# Patient Record
Sex: Female | Born: 1935 | Race: White | Hispanic: No | Smoking: Never smoker
Health system: Southern US, Community
[De-identification: ages and names within clinical notes are randomized; demographics above are authoritative.]

## PROBLEM LIST (undated history)

## (undated) DIAGNOSIS — I495 Sick sinus syndrome: Secondary | ICD-10-CM

## (undated) DIAGNOSIS — I2699 Other pulmonary embolism without acute cor pulmonale: Secondary | ICD-10-CM

## (undated) DIAGNOSIS — I10 Essential (primary) hypertension: Secondary | ICD-10-CM

## (undated) DIAGNOSIS — M069 Rheumatoid arthritis, unspecified: Secondary | ICD-10-CM

## (undated) DIAGNOSIS — J4530 Mild persistent asthma, uncomplicated: Secondary | ICD-10-CM

## (undated) DIAGNOSIS — I2510 Atherosclerotic heart disease of native coronary artery without angina pectoris: Secondary | ICD-10-CM

## (undated) DIAGNOSIS — E039 Hypothyroidism, unspecified: Secondary | ICD-10-CM

## (undated) DIAGNOSIS — E785 Hyperlipidemia, unspecified: Secondary | ICD-10-CM

## (undated) DIAGNOSIS — J8410 Pulmonary fibrosis, unspecified: Secondary | ICD-10-CM

## (undated) DIAGNOSIS — F0390 Unspecified dementia without behavioral disturbance: Secondary | ICD-10-CM

## (undated) DIAGNOSIS — H3530 Unspecified macular degeneration: Secondary | ICD-10-CM

## (undated) DIAGNOSIS — D649 Anemia, unspecified: Secondary | ICD-10-CM

## (undated) HISTORY — PX: CHOLECYSTECTOMY: SHX55

## (undated) HISTORY — DX: Mild persistent asthma, uncomplicated: J45.30

## (undated) HISTORY — PX: CARDIAC CATHETERIZATION: SHX172

## (undated) HISTORY — PX: ABDOMINAL HYSTERECTOMY: SUR658

## (undated) HISTORY — PX: TONSILLECTOMY AND ADENOIDECTOMY: SUR1326

---
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
## 2014-03-12 DIAGNOSIS — R079 Chest pain, unspecified: Secondary | ICD-10-CM | POA: Insufficient documentation

## 2014-03-12 DIAGNOSIS — R059 Cough, unspecified: Secondary | ICD-10-CM | POA: Insufficient documentation

## 2014-03-12 HISTORY — DX: Chest pain, unspecified: R07.9

## 2014-03-12 HISTORY — DX: Cough, unspecified: R05.9

## 2014-03-14 DIAGNOSIS — I442 Atrioventricular block, complete: Secondary | ICD-10-CM | POA: Insufficient documentation

## 2014-03-14 HISTORY — PX: PACEMAKER PLACEMENT: SHX43

## 2014-03-14 HISTORY — DX: Atrioventricular block, complete: I44.2

## 2014-06-18 DIAGNOSIS — Z950 Presence of cardiac pacemaker: Secondary | ICD-10-CM

## 2014-06-18 DIAGNOSIS — I495 Sick sinus syndrome: Secondary | ICD-10-CM | POA: Insufficient documentation

## 2014-06-18 HISTORY — DX: Presence of cardiac pacemaker: Z95.0

## 2014-11-23 DIAGNOSIS — R55 Syncope and collapse: Secondary | ICD-10-CM | POA: Insufficient documentation

## 2014-11-23 HISTORY — DX: Syncope and collapse: R55

## 2016-03-07 DIAGNOSIS — I10 Essential (primary) hypertension: Secondary | ICD-10-CM | POA: Diagnosis not present

## 2016-03-07 DIAGNOSIS — K219 Gastro-esophageal reflux disease without esophagitis: Secondary | ICD-10-CM

## 2016-03-07 DIAGNOSIS — E039 Hypothyroidism, unspecified: Secondary | ICD-10-CM

## 2016-03-07 DIAGNOSIS — I2699 Other pulmonary embolism without acute cor pulmonale: Secondary | ICD-10-CM

## 2016-03-07 DIAGNOSIS — E875 Hyperkalemia: Secondary | ICD-10-CM | POA: Diagnosis not present

## 2016-03-07 DIAGNOSIS — I2510 Atherosclerotic heart disease of native coronary artery without angina pectoris: Secondary | ICD-10-CM | POA: Diagnosis not present

## 2016-03-07 DIAGNOSIS — F0390 Unspecified dementia without behavioral disturbance: Secondary | ICD-10-CM

## 2016-03-07 DIAGNOSIS — M1990 Unspecified osteoarthritis, unspecified site: Secondary | ICD-10-CM

## 2016-03-07 DIAGNOSIS — M069 Rheumatoid arthritis, unspecified: Secondary | ICD-10-CM

## 2016-03-08 DIAGNOSIS — I2510 Atherosclerotic heart disease of native coronary artery without angina pectoris: Secondary | ICD-10-CM | POA: Diagnosis not present

## 2016-03-08 DIAGNOSIS — F0390 Unspecified dementia without behavioral disturbance: Secondary | ICD-10-CM | POA: Diagnosis not present

## 2016-03-08 DIAGNOSIS — E875 Hyperkalemia: Secondary | ICD-10-CM | POA: Diagnosis not present

## 2016-03-08 DIAGNOSIS — I10 Essential (primary) hypertension: Secondary | ICD-10-CM | POA: Diagnosis not present

## 2016-03-09 DIAGNOSIS — I2510 Atherosclerotic heart disease of native coronary artery without angina pectoris: Secondary | ICD-10-CM | POA: Diagnosis not present

## 2016-03-09 DIAGNOSIS — F0390 Unspecified dementia without behavioral disturbance: Secondary | ICD-10-CM | POA: Diagnosis not present

## 2016-03-09 DIAGNOSIS — I10 Essential (primary) hypertension: Secondary | ICD-10-CM | POA: Diagnosis not present

## 2016-03-09 DIAGNOSIS — E875 Hyperkalemia: Secondary | ICD-10-CM | POA: Diagnosis not present

## 2016-03-10 DIAGNOSIS — F0390 Unspecified dementia without behavioral disturbance: Secondary | ICD-10-CM | POA: Diagnosis not present

## 2016-03-10 DIAGNOSIS — E875 Hyperkalemia: Secondary | ICD-10-CM | POA: Diagnosis not present

## 2016-03-10 DIAGNOSIS — I2510 Atherosclerotic heart disease of native coronary artery without angina pectoris: Secondary | ICD-10-CM | POA: Diagnosis not present

## 2016-03-10 DIAGNOSIS — I10 Essential (primary) hypertension: Secondary | ICD-10-CM | POA: Diagnosis not present

## 2016-03-11 DIAGNOSIS — I10 Essential (primary) hypertension: Secondary | ICD-10-CM | POA: Diagnosis not present

## 2016-03-11 DIAGNOSIS — E875 Hyperkalemia: Secondary | ICD-10-CM | POA: Diagnosis not present

## 2016-03-11 DIAGNOSIS — F0390 Unspecified dementia without behavioral disturbance: Secondary | ICD-10-CM | POA: Diagnosis not present

## 2016-03-11 DIAGNOSIS — I2510 Atherosclerotic heart disease of native coronary artery without angina pectoris: Secondary | ICD-10-CM | POA: Diagnosis not present

## 2016-04-18 DIAGNOSIS — K219 Gastro-esophageal reflux disease without esophagitis: Secondary | ICD-10-CM

## 2016-04-18 DIAGNOSIS — R531 Weakness: Secondary | ICD-10-CM | POA: Diagnosis not present

## 2016-04-18 DIAGNOSIS — N390 Urinary tract infection, site not specified: Secondary | ICD-10-CM | POA: Diagnosis not present

## 2016-04-18 DIAGNOSIS — I2510 Atherosclerotic heart disease of native coronary artery without angina pectoris: Secondary | ICD-10-CM

## 2016-04-19 DIAGNOSIS — K219 Gastro-esophageal reflux disease without esophagitis: Secondary | ICD-10-CM | POA: Diagnosis not present

## 2016-04-19 DIAGNOSIS — I2510 Atherosclerotic heart disease of native coronary artery without angina pectoris: Secondary | ICD-10-CM | POA: Diagnosis not present

## 2016-04-19 DIAGNOSIS — N390 Urinary tract infection, site not specified: Secondary | ICD-10-CM | POA: Diagnosis not present

## 2016-04-19 DIAGNOSIS — R531 Weakness: Secondary | ICD-10-CM | POA: Diagnosis not present

## 2016-04-20 DIAGNOSIS — N390 Urinary tract infection, site not specified: Secondary | ICD-10-CM | POA: Diagnosis not present

## 2016-04-20 DIAGNOSIS — I2510 Atherosclerotic heart disease of native coronary artery without angina pectoris: Secondary | ICD-10-CM | POA: Diagnosis not present

## 2016-04-20 DIAGNOSIS — R531 Weakness: Secondary | ICD-10-CM | POA: Diagnosis not present

## 2016-04-20 DIAGNOSIS — K219 Gastro-esophageal reflux disease without esophagitis: Secondary | ICD-10-CM | POA: Diagnosis not present

## 2016-04-21 DIAGNOSIS — I2510 Atherosclerotic heart disease of native coronary artery without angina pectoris: Secondary | ICD-10-CM | POA: Diagnosis not present

## 2016-04-21 DIAGNOSIS — N390 Urinary tract infection, site not specified: Secondary | ICD-10-CM | POA: Diagnosis not present

## 2016-04-21 DIAGNOSIS — K219 Gastro-esophageal reflux disease without esophagitis: Secondary | ICD-10-CM | POA: Diagnosis not present

## 2016-04-21 DIAGNOSIS — R531 Weakness: Secondary | ICD-10-CM | POA: Diagnosis not present

## 2016-04-22 DIAGNOSIS — K219 Gastro-esophageal reflux disease without esophagitis: Secondary | ICD-10-CM | POA: Diagnosis not present

## 2016-04-22 DIAGNOSIS — R531 Weakness: Secondary | ICD-10-CM | POA: Diagnosis not present

## 2016-04-22 DIAGNOSIS — I2510 Atherosclerotic heart disease of native coronary artery without angina pectoris: Secondary | ICD-10-CM | POA: Diagnosis not present

## 2016-04-22 DIAGNOSIS — N390 Urinary tract infection, site not specified: Secondary | ICD-10-CM | POA: Diagnosis not present

## 2017-09-25 ENCOUNTER — Emergency Department (HOSPITAL_COMMUNITY): Payer: Medicare Other

## 2017-09-25 ENCOUNTER — Encounter: Payer: Self-pay | Admitting: Emergency Medicine

## 2017-09-25 ENCOUNTER — Inpatient Hospital Stay (HOSPITAL_COMMUNITY)
Admission: EM | Admit: 2017-09-25 | Discharge: 2017-09-30 | DRG: 492 | Disposition: A | Discharge status: Skilled Nursing Facility | Payer: Medicare Other | Attending: Family Medicine | Admitting: Family Medicine

## 2017-09-25 DIAGNOSIS — Z950 Presence of cardiac pacemaker: Secondary | ICD-10-CM | POA: Diagnosis not present

## 2017-09-25 DIAGNOSIS — F32A Depression, unspecified: Secondary | ICD-10-CM | POA: Diagnosis present

## 2017-09-25 DIAGNOSIS — T45515A Adverse effect of anticoagulants, initial encounter: Secondary | ICD-10-CM

## 2017-09-25 DIAGNOSIS — K219 Gastro-esophageal reflux disease without esophagitis: Secondary | ICD-10-CM | POA: Diagnosis present

## 2017-09-25 DIAGNOSIS — F329 Major depressive disorder, single episode, unspecified: Secondary | ICD-10-CM | POA: Diagnosis present

## 2017-09-25 DIAGNOSIS — I4891 Unspecified atrial fibrillation: Secondary | ICD-10-CM | POA: Diagnosis present

## 2017-09-25 DIAGNOSIS — D6832 Hemorrhagic disorder due to extrinsic circulating anticoagulants: Secondary | ICD-10-CM | POA: Diagnosis present

## 2017-09-25 DIAGNOSIS — E039 Hypothyroidism, unspecified: Secondary | ICD-10-CM | POA: Diagnosis present

## 2017-09-25 DIAGNOSIS — S72011A Unspecified intracapsular fracture of right femur, initial encounter for closed fracture: Secondary | ICD-10-CM | POA: Diagnosis not present

## 2017-09-25 DIAGNOSIS — Z7901 Long term (current) use of anticoagulants: Secondary | ICD-10-CM | POA: Diagnosis not present

## 2017-09-25 DIAGNOSIS — D649 Anemia, unspecified: Secondary | ICD-10-CM | POA: Diagnosis present

## 2017-09-25 DIAGNOSIS — Y9289 Other specified places as the place of occurrence of the external cause: Secondary | ICD-10-CM

## 2017-09-25 DIAGNOSIS — I2510 Atherosclerotic heart disease of native coronary artery without angina pectoris: Secondary | ICD-10-CM | POA: Diagnosis present

## 2017-09-25 DIAGNOSIS — Z66 Do not resuscitate: Secondary | ICD-10-CM | POA: Diagnosis present

## 2017-09-25 DIAGNOSIS — Z79899 Other long term (current) drug therapy: Secondary | ICD-10-CM

## 2017-09-25 DIAGNOSIS — S7291XA Unspecified fracture of right femur, initial encounter for closed fracture: Secondary | ICD-10-CM

## 2017-09-25 DIAGNOSIS — Z885 Allergy status to narcotic agent status: Secondary | ICD-10-CM | POA: Diagnosis not present

## 2017-09-25 DIAGNOSIS — S72351A Displaced comminuted fracture of shaft of right femur, initial encounter for closed fracture: Secondary | ICD-10-CM | POA: Diagnosis present

## 2017-09-25 DIAGNOSIS — Z419 Encounter for procedure for purposes other than remedying health state, unspecified: Secondary | ICD-10-CM

## 2017-09-25 DIAGNOSIS — M9701XA Periprosthetic fracture around internal prosthetic right hip joint, initial encounter: Secondary | ICD-10-CM | POA: Diagnosis present

## 2017-09-25 DIAGNOSIS — I10 Essential (primary) hypertension: Secondary | ICD-10-CM | POA: Diagnosis present

## 2017-09-25 DIAGNOSIS — Y9301 Activity, walking, marching and hiking: Secondary | ICD-10-CM | POA: Diagnosis present

## 2017-09-25 DIAGNOSIS — W010XXA Fall on same level from slipping, tripping and stumbling without subsequent striking against object, initial encounter: Secondary | ICD-10-CM | POA: Diagnosis present

## 2017-09-25 DIAGNOSIS — J449 Chronic obstructive pulmonary disease, unspecified: Secondary | ICD-10-CM | POA: Diagnosis present

## 2017-09-25 DIAGNOSIS — W19XXXA Unspecified fall, initial encounter: Secondary | ICD-10-CM

## 2017-09-25 DIAGNOSIS — T148XXA Other injury of unspecified body region, initial encounter: Secondary | ICD-10-CM | POA: Diagnosis present

## 2017-09-25 HISTORY — DX: Hemorrhagic disorder due to extrinsic circulating anticoagulants: D68.32

## 2017-09-25 HISTORY — DX: Depression, unspecified: F32.A

## 2017-09-25 HISTORY — DX: Gastro-esophageal reflux disease without esophagitis: K21.9

## 2017-09-25 HISTORY — DX: Unspecified fracture of right femur, initial encounter for closed fracture: S72.91XA

## 2017-09-25 HISTORY — DX: Hypothyroidism, unspecified: E03.9

## 2017-09-25 LAB — CBC WITH DIFFERENTIAL/PLATELET
Basophils Absolute: 0 10*3/uL (ref 0.0–0.1)
Basophils Relative: 0 %
Eosinophils Absolute: 0.1 10*3/uL (ref 0.0–0.7)
Eosinophils Relative: 1 %
HCT: 38.1 % (ref 36.0–46.0)
Hemoglobin: 12.6 g/dL (ref 12.0–15.0)
Lymphocytes Relative: 35 %
Lymphs Abs: 2.8 10*3/uL (ref 0.7–4.0)
MCH: 32.8 pg (ref 26.0–34.0)
MCHC: 33.1 g/dL (ref 30.0–36.0)
MCV: 99.2 fL (ref 78.0–100.0)
Monocytes Absolute: 0.6 10*3/uL (ref 0.1–1.0)
Monocytes Relative: 7 %
Neutro Abs: 4.4 10*3/uL (ref 1.7–7.7)
Neutrophils Relative %: 57 %
Platelets: 242 10*3/uL (ref 150–400)
RBC: 3.84 MIL/uL — ABNORMAL LOW (ref 3.87–5.11)
RDW: 13.2 % (ref 11.5–15.5)
WBC: 7.9 10*3/uL (ref 4.0–10.5)

## 2017-09-25 LAB — BASIC METABOLIC PANEL
Anion gap: 9 (ref 5–15)
BUN: 17 mg/dL (ref 6–20)
CO2: 25 mmol/L (ref 22–32)
Calcium: 9 mg/dL (ref 8.9–10.3)
Chloride: 104 mmol/L (ref 101–111)
Creatinine, Ser: 0.61 mg/dL (ref 0.44–1.00)
GFR calc Af Amer: >60 mL/min (ref >60)
GFR calc non Af Amer: >60 mL/min (ref >60)
Glucose, Bld: 120 mg/dL — ABNORMAL HIGH (ref 65–99)
Potassium: 3.8 mmol/L (ref 3.5–5.1)
Sodium: 138 mmol/L (ref 135–145)

## 2017-09-25 LAB — PROTIME-INR
INR: 1.91
Prothrombin Time: 21.7 seconds — ABNORMAL HIGH (ref 11.4–15.2)

## 2017-09-25 MED ORDER — FENTANYL CITRATE (PF) 100 MCG/2ML IJ SOLN
50.0000 ug | Freq: Once | INTRAMUSCULAR | Status: AC
  Administered 2017-09-25: 50 ug via INTRAVENOUS
  Filled 2017-09-25: qty 2

## 2017-09-25 NOTE — ED Triage Notes (Signed)
Pt arrived via [facility] from [facility] in [place] after an unwittnessed fall in the bathroom tonight.  EMS states pt hit head but had no LOC.  EMS "more concerned with right femur injury".  EMS lifts sheet and shows where pts right leg is folded under left leg and foot is dangling off side of stretcher, states this is how pt is most comfortable.  EMS gave fentanyl.  Pt arrives with no c-collar.  DNR at bedside.

## 2017-09-25 NOTE — ED Provider Notes (Signed)
[facility] EMERGENCY DEPARTMENT Provider Note   CSN: [id number] Arrival date & time: 09/25/17  2044     History   Chief Complaint Chief Complaint  Patient presents with  . Fall  . Leg Injury    HPI Maria Cameron is a 84 y.o. female.  Patient presents to the emergency department with a chief complaint of mechanical fall.  She states that she fell while walking to the bathroom.  She complains of right lower extremity pain.  She has been unable to ambulate since the fall.  EMS report, she may have hit her head.  Patient states that she takes Coumadin.  She is uncertain why she takes this.  She denies headache, neck pain, chest pain, shortness of breath, or abdominal pain.  She complains of pain only in her right hip and right leg.  She was given 50 mcg of fentanyl by EMS with some improvement of her pain.  She denies any other associated symptoms.  She has had hip surgery in the past in [place] approximately 20 years ago.  Patient makes her own medical decisions.  POA- Daughter Lurena Joiner in [phone] Also has daughter in [place]- [phone]  The history is provided by the patient. No language interpreter was used.    No past medical history on file.  There are no active problems to display for this patient.   OB History   None      Home Medications    Prior to Admission medications   Not on File    Family History No family history on file.  Social History Social History   Tobacco Use  . Smoking status: Not on file  Substance Use Topics  . Alcohol use: Not on file  . Drug use: Not on file     Allergies   Codeine   Review of Systems Review of Systems  All other systems reviewed and are negative.    Physical Exam Updated Vital Signs BP (!) 177/66 (BP Location: Right Arm)   Pulse 65   Temp 98.4 F (36.9 C) (Oral)   Resp 16   SpO2 97%   Physical Exam  Constitutional: She is oriented to  person, place, and time. She appears well-developed and well-nourished.  HENT:  Head: Normocephalic and atraumatic.  Eyes: Pupils are equal, round, and reactive to light. Conjunctivae and EOM are normal.  Neck: Normal range of motion. Neck supple.  Cardiovascular: Normal rate, regular rhythm and intact distal pulses. Exam reveals no gallop and no friction rub.  No murmur heard. Distal pulses intact  Pulmonary/Chest: Effort normal and breath sounds normal. No respiratory distress. She has no wheezes. She has no rales. She exhibits no tenderness.  Abdominal: Soft. Bowel sounds are normal. She exhibits no distension and no mass. There is no tenderness. There is no rebound and no guarding.  Musculoskeletal: She exhibits tenderness and deformity. She exhibits no edema.  Right lower extremity shortened and internally rotated No open wounds No other abnormality or deformity of remaining extremities  Neurological: She is alert and oriented to person, place, and time.  Skin: Skin is warm and dry.  Psychiatric: She has a normal mood and affect. Her behavior is normal. Judgment and thought content normal.  Nursing note and vitals reviewed.    ED Treatments / Results  Labs (all labs ordered are listed, but only abnormal results are displayed) Labs Reviewed  CBC WITH DIFFERENTIAL/PLATELET - Abnormal; Notable for the following components:  Result Value   RBC 3.84 (*)    All other components within normal limits  BASIC METABOLIC PANEL - Abnormal; Notable for the following components:   Glucose, Bld 120 (*)    All other components within normal limits    EKG None  Radiology Dg Chest 1 View  Result Date: 09/25/2017 CLINICAL DATA:  Unwitnessed fall preop. EXAM: CHEST  1 VIEW COMPARISON:  None. FINDINGS: Low lung volumes without acute pneumonic consolidation or CHF. No effusion. Heart size is normal with aortic atherosclerosis. Left-sided pacemaker apparatus with right atrial and right  ventricular leads are noted. IMPRESSION: No active disease.  Aortic atherosclerosis. Electronically Signed   By: Tollie Eth M.D.   On: 09/25/2017 22:59   Ct Head Wo Contrast  Result Date: 09/25/2017 CLINICAL DATA:  Unwitnessed fall in the bathroom this evening. EXAM: CT HEAD WITHOUT CONTRAST CT CERVICAL SPINE WITHOUT CONTRAST TECHNIQUE: Multidetector CT imaging of the head and cervical spine was performed following the standard protocol without intravenous contrast. Multiplanar CT image reconstructions of the cervical spine were also generated. COMPARISON:  None. FINDINGS: CT HEAD FINDINGS BRAIN: There is sulcal and ventricular prominence consistent with superficial and central atrophy. No intraparenchymal hemorrhage, mass effect nor midline shift. Periventricular and subcortical white matter hypodensities consistent with chronic small vessel ischemic disease are identified. No acute large vascular territory infarcts. No abnormal extra-axial fluid collections. Basal cisterns are not effaced and midline. VASCULAR: Moderate calcific atherosclerosis of the carotid siphons. SKULL: No skull fracture. No significant scalp soft tissue swelling. SINUSES/ORBITS: The mastoid air-cells are clear. Right maxillary sinus atelectasis with small appearing sinus cavity completely filled with mucus. Right maxillary sinus wall thickening is also noted compatible with chronic sinusitis. Included ocular globes and orbital contents are non-suspicious. Right cataract extraction. OTHER: None. CT CERVICAL SPINE FINDINGS Alignment: Maintained cervical lordosis. Skull base and vertebrae: Osteopenia without acute cervical spine fracture. Intact skull base. Soft tissues and spinal canal: No prevertebral fluid or swelling. No visible canal hematoma. Disc levels: Marked disc space narrowing from C2 through C6 with small posterior marginal osteophytes. No jumped or perched facets. Uncovertebral joint osteoarthritis is seen bilaterally from C2  through C6. No significant central or neural foraminal encroachment. Upper chest: Apical pleuroparenchymal scarring bilaterally. Other: None IMPRESSION: 1. Atrophy with chronic white matter small vessel ischemia. No acute intracranial abnormality. 2. Right maxillary sinus atelectasis with complete opacification of the included maxillary sinus. Given wall thickening, findings are in keeping with chronic sinusitis. 3. Cervical spondylosis with marked disc space narrowing from C2 through C6. Uncovertebral joint osteoarthritis is also noted as well as facet arthropathy. No acute cervical spine fracture. Electronically Signed   By: Tollie Eth M.D.   On: 09/25/2017 22:23   Ct Cervical Spine Wo Contrast  Result Date: 09/25/2017 CLINICAL DATA:  Unwitnessed fall in the bathroom this evening. EXAM: CT HEAD WITHOUT CONTRAST CT CERVICAL SPINE WITHOUT CONTRAST TECHNIQUE: Multidetector CT imaging of the head and cervical spine was performed following the standard protocol without intravenous contrast. Multiplanar CT image reconstructions of the cervical spine were also generated. COMPARISON:  None. FINDINGS: CT HEAD FINDINGS BRAIN: There is sulcal and ventricular prominence consistent with superficial and central atrophy. No intraparenchymal hemorrhage, mass effect nor midline shift. Periventricular and subcortical white matter hypodensities consistent with chronic small vessel ischemic disease are identified. No acute large vascular territory infarcts. No abnormal extra-axial fluid collections. Basal cisterns are not effaced and midline. VASCULAR: Moderate calcific atherosclerosis of the carotid siphons. SKULL: No skull  fracture. No significant scalp soft tissue swelling. SINUSES/ORBITS: The mastoid air-cells are clear. Right maxillary sinus atelectasis with small appearing sinus cavity completely filled with mucus. Right maxillary sinus wall thickening is also noted compatible with chronic sinusitis. Included ocular globes  and orbital contents are non-suspicious. Right cataract extraction. OTHER: None. CT CERVICAL SPINE FINDINGS Alignment: Maintained cervical lordosis. Skull base and vertebrae: Osteopenia without acute cervical spine fracture. Intact skull base. Soft tissues and spinal canal: No prevertebral fluid or swelling. No visible canal hematoma. Disc levels: Marked disc space narrowing from C2 through C6 with small posterior marginal osteophytes. No jumped or perched facets. Uncovertebral joint osteoarthritis is seen bilaterally from C2 through C6. No significant central or neural foraminal encroachment. Upper chest: Apical pleuroparenchymal scarring bilaterally. Other: None IMPRESSION: 1. Atrophy with chronic white matter small vessel ischemia. No acute intracranial abnormality. 2. Right maxillary sinus atelectasis with complete opacification of the included maxillary sinus. Given wall thickening, findings are in keeping with chronic sinusitis. 3. Cervical spondylosis with marked disc space narrowing from C2 through C6. Uncovertebral joint osteoarthritis is also noted as well as facet arthropathy. No acute cervical spine fracture. Electronically Signed   By: Tollie Eth M.D.   On: 09/25/2017 22:23   Dg Hip Unilat W Or Wo Pelvis 2-3 Views Right  Result Date: 09/25/2017 CLINICAL DATA:  Unwitnessed fall.  Right hip and leg pain. EXAM: DG HIP (WITH OR WITHOUT PELVIS) 2-3V RIGHT COMPARISON:  None. FINDINGS: No acute laterally displaced fracture of the femoral diaphysis with dorsal angulation is noted just distal to the cemented right hip arthroplasty. The fracture undermines the tip of the femoral cement. No joint dislocation is seen. There is lower lumbar degenerative disc and facet arthropathy. Bony pelvis appears intact. The native left hip joint is maintained. IMPRESSION: Acute, closed, dorsally angulated and laterally displaced fracture of the femoral diaphysis. Electronically Signed   By: Tollie Eth M.D.   On:  09/25/2017 22:49   Dg Femur Min 2 Views Right  Result Date: 09/25/2017 CLINICAL DATA:  Unwitnessed fall.  Leg pain. EXAM: RIGHT FEMUR 2 VIEWS COMPARISON:  None. FINDINGS: An acute, comminuted fracture involving the proximal femoral diaphysis undermining the cement-bone interface of a pre-existing right hip arthroplasty is identified. There is dorsal angulation of the distal fracture fragment with internal rotation of the knee seen on the frog-leg view. There is anterior and lateral displacement 1/4 shaft width of the distal fracture fragment. IMPRESSION: Acute, comminuted fracture involving the proximal femoral diaphysis with dorsal angulation of the distal fracture fragment and internal rotation of the. 1/4 shaft width anterior and lateral displacement is also noted of the distal fracture fragment. Electronically Signed   By: Tollie Eth M.D.   On: 09/25/2017 22:55    Procedures Procedures (including critical care time)  Medications Ordered in ED Medications  fentaNYL (SUBLIMAZE) injection 50 mcg (has no administration in time range)     Initial Impression / Assessment and Plan / ED Course  I have reviewed the triage vital signs and the nursing notes.  Pertinent labs & imaging results that were available during my care of the patient were reviewed by me and considered in my medical decision making (see chart for details).      Patient with mechanical fall and right femur fracture.  Anticoagulated on Coumadin.  Coags pending.  Distal pulses and sensation intact.  CT imaging of head and cervical spine are unremarkable for new acute traumatic findings.  History of right hip surgery 20  years ago in [place].  Will consult on-call orthopedic to evaluate right femur fracture.  Appreciate telephone consultation with Dr. Aundria Rud from orthopedics.  Aundria Rud will come and evaluate the patient tonight, and recommends hospitalist admission.  He states that surgery would not take place  tonight or tomorrow.  Target INR 1.0-1.2.  We will give vitamin K if greater than 1.2.  11:29 PM Discussed with Dr. Mikeal Hawthorne from [facility], who will admit the patient.  Final Clinical Impressions(s) / ED Diagnoses   Final diagnoses:  Closed displaced comminuted fracture of shaft of right femur, initial encounter [facility])  Fall, initial encounter    ED Discharge Orders    None       Roxy Horseman, PA-C 09/25/17 2329    Rolan Bucco, MD 09/25/17 [phone]

## 2017-09-25 NOTE — ED Notes (Signed)
Patient transported to CT 

## 2017-09-25 NOTE — ED Notes (Signed)
Pt still with radiology 

## 2017-09-25 NOTE — H&P (Signed)
History and Physical    Maria Cameron GLO:[id number] DOB: May 12, 1924 DOA: 09/25/2017  Referring MD/NP/PA: Roxy Horseman, PA  PCP: No primary care provider on file.   Outpatient Specialists: None   Patient coming from: ALF  Chief Complaint: Fall with right LE Fracture  HPI: Maria Cameron is a 84 y.o. female with medical history significant of HTN, GERD, Chronic anticoagulation and CAD who was in ALF and sustained a fall when she walked to the bathroom in the dark. She tripped and fell. Sustained right proximal comminuted Femural fracture.  Patient has no loss of consciousness. No other fractures or injury. On so many medications that have not been reviewed for years. Has CAD but stable for 20 years with no Cardiac events. Had 3 stents back then. No prior Orthopedics injury, she was fully functional requiring minimal assistance.  ED Course: Patient evaluated and Orthopedics called. Has X ray showing Acute, comminuted fracture involving the proximal femoral diaphysis with dorsal angulation of the distal fracture fragment and internal rotation of the. 1/4 shaft width anterior and lateral displacement is also noted of the distal fracture fragment. INR is 1.95.   Review of Systems: As per HPI otherwise 10 point review of systems negative.    History reviewed. No pertinent past medical history.   has no tobacco, alcohol, and drug history on file.  Allergies  Allergen Reactions  . Codeine Other (See Comments)    Pt reports allergy but not documented    No family history on file.   Prior to Admission medications   Medication Sig Start Date End Date Taking? Authorizing Provider  acetaminophen (TYLENOL) 325 MG tablet Take 650 mg by mouth every 6 (six) hours as needed for mild pain or fever.   Yes [provider]  acetaminophen (TYLENOL) 500 MG tablet Take 1,000 mg by mouth 3 (three) times daily.   Yes [provider]  albuterol (PROVENTIL HFA;VENTOLIN HFA) 108 (90 Base)  MCG/ACT inhaler Inhale 2 puffs into the lungs every 6 (six) hours as needed for wheezing or shortness of breath.   Yes [provider]  amLODipine (NORVASC) 5 MG tablet Take 5 mg by mouth daily.   Yes [provider]  ARIPiprazole (ABILIFY) 5 MG tablet Take 2.5 mg by mouth daily.   Yes [provider]  atorvastatin (LIPITOR) 20 MG tablet Take 20 mg by mouth at bedtime.   Yes [provider]  bimatoprost (LUMIGAN) 0.01 % SOLN Place 1 drop into both eyes at bedtime.   Yes [provider]  carbamazepine (TEGRETOL) 100 MG chewable tablet Chew 100 mg by mouth 2 (two) times daily.   Yes [provider]  Carboxymethylcellulose Sod PF (REFRESH PLUS) 0.5 % SOLN Place 1 drop into both eyes daily as needed (for eye irritation).   Yes [provider]  dicyclomine (BENTYL) 10 MG capsule Take 10 mg by mouth 4 (four) times daily -  before meals and at bedtime.   Yes [provider]  diphenhydrAMINE (BENADRYL) 25 MG tablet Take 25 mg by mouth every 4 (four) hours as needed for allergies.   Yes [provider]  docusate sodium (COLACE) 100 MG capsule Take 100 mg by mouth 2 (two) times daily.   Yes [provider]  escitalopram (LEXAPRO) 10 MG tablet Take 10 mg by mouth daily.   Yes [provider]  guaifenesin (ROBITUSSIN) 100 MG/5ML syrup Take 200 mg by mouth 4 (four) times daily as needed for cough.   Yes [provider]  levothyroxine (SYNTHROID, LEVOTHROID) 25 MCG tablet Take 25 mcg by mouth daily before breakfast.   Yes [provider]  loperamide (IMODIUM) 2 MG capsule Take 2 mg by mouth as needed for diarrhea or loose stools.   Yes [provider]  LORazepam (ATIVAN) 0.5 MG tablet Take 0.25 mg by mouth daily as needed for anxiety.   Yes [provider]  Melatonin 3 MG TABS Take 3 mg by mouth at bedtime.   Yes [provider]  mirtazapine (REMERON) 7.5 MG tablet Take  7.5 mg by mouth at bedtime.   Yes [provider]  pantoprazole (PROTONIX) 40 MG tablet Take 40 mg by mouth daily.   Yes [provider]  polyethylene glycol (MIRALAX / GLYCOLAX) packet Take 17 g by mouth daily.   Yes [provider]  simethicone (MYLICON) 80 MG chewable tablet Chew 80 mg by mouth 3 (three) times daily as needed for flatulence.   Yes [provider]  traMADol (ULTRAM) 50 MG tablet Take 50 mg by mouth every 6 (six) hours as needed for moderate pain.   Yes [provider]  warfarin (COUMADIN) 1 MG tablet Take 1.5-2 mg by mouth See admin instructions. Take 1 and 1/2 tablets on Tuesday and Thursday then take 2 tablets all the other days   Yes [provider]    Physical Exam: Vitals:   09/25/17 2052  BP: (!) 177/66  Pulse: 65  Resp: 16  Temp: 98.4 F (36.9 C)  TempSrc: Oral  SpO2: 97%      Constitutional: NAD, calm, comfortable Vitals:   09/25/17 2052  BP: (!) 177/66  Pulse: 65  Resp: 16  Temp: 98.4 F (36.9 C)  TempSrc: Oral  SpO2: 97%   Eyes: PERRL, lids and conjunctivae normal ENMT: Mucous membranes are moist. Posterior pharynx clear of any exudate or lesions.Normal dentition.  Neck: normal, supple, no masses, no thyromegaly Respiratory: clear to auscultation bilaterally, no wheezing, no crackles. Normal respiratory effort. No accessory muscle use.  Cardiovascular: Regular rate and rhythm, no murmurs / rubs / gallops. No extremity edema. 2+ pedal pulses. No carotid bruits.  Abdomen: no tenderness, no masses palpated. No hepatosplenomegaly. Bowel sounds positive.  Musculoskeletal: Right LE laterally rotated, swollen Skin: no rashes, lesions, ulcers. No induration Neurologic: CN 2-12 grossly intact. Sensation intact, DTR normal. Strength 5/5 in all 4.  Psychiatric: Normal judgment and insight. Alert and oriented x 3. Normal mood.    Labs on Admission: I have personally reviewed following labs and imaging  studies  CBC: Recent Labs  Lab 09/25/17 2140  WBC 7.9  NEUTROABS 4.4  HGB 12.6  HCT 38.1  MCV 99.2  PLT 242   Basic Metabolic Panel: Recent Labs  Lab 09/25/17 2140  NA 138  K 3.8  CL 104  CO2 25  GLUCOSE 120*  BUN 17  CREATININE 0.61  CALCIUM 9.0   GFR: CrCl cannot be calculated (Unknown ideal weight.). Liver Function Tests: No results for input(s): AST, ALT, ALKPHOS, BILITOT, PROT, ALBUMIN in the last 168 hours. No results for input(s): LIPASE, AMYLASE in the last 168 hours. No results for input(s): AMMONIA in the last 168 hours. Coagulation Profile: Recent Labs  Lab 09/25/17 2313  INR 1.91   Cardiac Enzymes: No results for input(s): CKTOTAL, CKMB, CKMBINDEX, TROPONINI in the last 168 hours. BNP (last 3 results) No results for input(s): PROBNP in the last 8760 hours. HbA1C: No results for input(s): HGBA1C in the last 72 hours. CBG:  No results for input(s): GLUCAP in the last 168 hours. Lipid Profile: No results for input(s): CHOL, HDL, LDLCALC, TRIG, CHOLHDL, LDLDIRECT in the last 72 hours. Thyroid Function Tests: No results for input(s): TSH, T4TOTAL, FREET4, T3FREE, THYROIDAB in the last 72 hours. Anemia Panel: No results for input(s): VITAMINB12, FOLATE, FERRITIN, TIBC, IRON, RETICCTPCT in the last 72 hours. Urine analysis: No results found for: COLORURINE, APPEARANCEUR, LABSPEC, PHURINE, GLUCOSEU, HGBUR, BILIRUBINUR, KETONESUR, PROTEINUR, UROBILINOGEN, NITRITE, LEUKOCYTESUR Sepsis Labs: @LABRCNTIP (procalcitonin:4,lacticidven:4) )No results found for this or any previous visit (from the past 240 hour(s)).   Radiological Exams on Admission: Dg Chest 1 View  Result Date: 09/25/2017 CLINICAL DATA:  Unwitnessed fall preop. EXAM: CHEST  1 VIEW COMPARISON:  None. FINDINGS: Low lung volumes without acute pneumonic consolidation or CHF. No effusion. Heart size is normal with aortic atherosclerosis. Left-sided pacemaker apparatus with right atrial and right  ventricular leads are noted. IMPRESSION: No active disease.  Aortic atherosclerosis. Electronically Signed   By: 09/27/2017 M.D.   On: 09/25/2017 22:59   Ct Head Wo Contrast  Result Date: 09/25/2017 CLINICAL DATA:  Unwitnessed fall in the bathroom this evening. EXAM: CT HEAD WITHOUT CONTRAST CT CERVICAL SPINE WITHOUT CONTRAST TECHNIQUE: Multidetector CT imaging of the head and cervical spine was performed following the standard protocol without intravenous contrast. Multiplanar CT image reconstructions of the cervical spine were also generated. COMPARISON:  None. FINDINGS: CT HEAD FINDINGS BRAIN: There is sulcal and ventricular prominence consistent with superficial and central atrophy. No intraparenchymal hemorrhage, mass effect nor midline shift. Periventricular and subcortical white matter hypodensities consistent with chronic small vessel ischemic disease are identified. No acute large vascular territory infarcts. No abnormal extra-axial fluid collections. Basal cisterns are not effaced and midline. VASCULAR: Moderate calcific atherosclerosis of the carotid siphons. SKULL: No skull fracture. No significant scalp soft tissue swelling. SINUSES/ORBITS: The mastoid air-cells are clear. Right maxillary sinus atelectasis with small appearing sinus cavity completely filled with mucus. Right maxillary sinus wall thickening is also noted compatible with chronic sinusitis. Included ocular globes and orbital contents are non-suspicious. Right cataract extraction. OTHER: None. CT CERVICAL SPINE FINDINGS Alignment: Maintained cervical lordosis. Skull base and vertebrae: Osteopenia without acute cervical spine fracture. Intact skull base. Soft tissues and spinal canal: No prevertebral fluid or swelling. No visible canal hematoma. Disc levels: Marked disc space narrowing from C2 through C6 with small posterior marginal osteophytes. No jumped or perched facets. Uncovertebral joint osteoarthritis is seen bilaterally from C2  through C6. No significant central or neural foraminal encroachment. Upper chest: Apical pleuroparenchymal scarring bilaterally. Other: None IMPRESSION: 1. Atrophy with chronic white matter small vessel ischemia. No acute intracranial abnormality. 2. Right maxillary sinus atelectasis with complete opacification of the included maxillary sinus. Given wall thickening, findings are in keeping with chronic sinusitis. 3. Cervical spondylosis with marked disc space narrowing from C2 through C6. Uncovertebral joint osteoarthritis is also noted as well as facet arthropathy. No acute cervical spine fracture. Electronically Signed   By: 09/27/2017 M.D.   On: 09/25/2017 22:23   Ct Cervical Spine Wo Contrast  Result Date: 09/25/2017 CLINICAL DATA:  Unwitnessed fall in the bathroom this evening. EXAM: CT HEAD WITHOUT CONTRAST CT CERVICAL SPINE WITHOUT CONTRAST TECHNIQUE: Multidetector CT imaging of the head and cervical spine was performed following the standard protocol without intravenous contrast. Multiplanar CT image reconstructions of the cervical spine were also generated. COMPARISON:  None. FINDINGS: CT HEAD FINDINGS BRAIN: There is sulcal and ventricular prominence consistent with superficial and central  atrophy. No intraparenchymal hemorrhage, mass effect nor midline shift. Periventricular and subcortical white matter hypodensities consistent with chronic small vessel ischemic disease are identified. No acute large vascular territory infarcts. No abnormal extra-axial fluid collections. Basal cisterns are not effaced and midline. VASCULAR: Moderate calcific atherosclerosis of the carotid siphons. SKULL: No skull fracture. No significant scalp soft tissue swelling. SINUSES/ORBITS: The mastoid air-cells are clear. Right maxillary sinus atelectasis with small appearing sinus cavity completely filled with mucus. Right maxillary sinus wall thickening is also noted compatible with chronic sinusitis. Included ocular globes  and orbital contents are non-suspicious. Right cataract extraction. OTHER: None. CT CERVICAL SPINE FINDINGS Alignment: Maintained cervical lordosis. Skull base and vertebrae: Osteopenia without acute cervical spine fracture. Intact skull base. Soft tissues and spinal canal: No prevertebral fluid or swelling. No visible canal hematoma. Disc levels: Marked disc space narrowing from C2 through C6 with small posterior marginal osteophytes. No jumped or perched facets. Uncovertebral joint osteoarthritis is seen bilaterally from C2 through C6. No significant central or neural foraminal encroachment. Upper chest: Apical pleuroparenchymal scarring bilaterally. Other: None IMPRESSION: 1. Atrophy with chronic white matter small vessel ischemia. No acute intracranial abnormality. 2. Right maxillary sinus atelectasis with complete opacification of the included maxillary sinus. Given wall thickening, findings are in keeping with chronic sinusitis. 3. Cervical spondylosis with marked disc space narrowing from C2 through C6. Uncovertebral joint osteoarthritis is also noted as well as facet arthropathy. No acute cervical spine fracture. Electronically Signed   By: Tollie Eth M.D.   On: 09/25/2017 22:23   Dg Hip Unilat W Or Wo Pelvis 2-3 Views Right  Result Date: 09/25/2017 CLINICAL DATA:  Unwitnessed fall.  Right hip and leg pain. EXAM: DG HIP (WITH OR WITHOUT PELVIS) 2-3V RIGHT COMPARISON:  None. FINDINGS: No acute laterally displaced fracture of the femoral diaphysis with dorsal angulation is noted just distal to the cemented right hip arthroplasty. The fracture undermines the tip of the femoral cement. No joint dislocation is seen. There is lower lumbar degenerative disc and facet arthropathy. Bony pelvis appears intact. The native left hip joint is maintained. IMPRESSION: Acute, closed, dorsally angulated and laterally displaced fracture of the femoral diaphysis. Electronically Signed   By: Tollie Eth M.D.   On:  09/25/2017 22:49   Dg Femur Min 2 Views Right  Result Date: 09/25/2017 CLINICAL DATA:  Unwitnessed fall.  Leg pain. EXAM: RIGHT FEMUR 2 VIEWS COMPARISON:  None. FINDINGS: An acute, comminuted fracture involving the proximal femoral diaphysis undermining the cement-bone interface of a pre-existing right hip arthroplasty is identified. There is dorsal angulation of the distal fracture fragment with internal rotation of the knee seen on the frog-leg view. There is anterior and lateral displacement 1/4 shaft width of the distal fracture fragment. IMPRESSION: Acute, comminuted fracture involving the proximal femoral diaphysis with dorsal angulation of the distal fracture fragment and internal rotation of the. 1/4 shaft width anterior and lateral displacement is also noted of the distal fracture fragment. Electronically Signed   By: Tollie Eth M.D.   On: 09/25/2017 22:55    Assessment/Plan Principal Problem:   Right femoral fracture (HCC) Active Problems:   Warfarin-induced coagulopathy (HCC)   CAD (coronary artery disease)   Hypothyroid   GERD (gastroesophageal reflux disease)   Depression    #1 Right Proximal Femoral fracture: Patient will be admitted and cleared for surgery. Will hold warfarin and aim at goal INR of less than 1.2. Orthopedics consulted already. Has remote CAD but no symptoms for years. Will  obtain EKG and if abnormal, will order echocardiogram prior to surgery.  #2 Chronic Anticoagulation: patient reported no Afib and no hx of PE or DVT. Placed on warfarin years ago but not reviewed need for continuation. May not need to continue long term  #3 Hypothyroidsm: Continue Levothyroxine  #4 HTN: Continue home regimen  #5 GERD: Continue PPI  #6 Depression: Continue Lexapro   DVT prophylaxis: Lovenox    Code Status: DNR   Family Communication: None available  Disposition Plan: SNF   Consults called: Dr Aundria Rud, Orthopedics  Admission status: inpatient  Severity of  Illness: The appropriate patient status for this patient is INPATIENT. Inpatient status is judged to be reasonable and necessary in order to provide the required intensity of service to ensure the patient's safety. The patient's presenting symptoms, physical exam findings, and initial radiographic and laboratory data in the context of their chronic comorbidities is felt to place them at high risk for further clinical deterioration. Furthermore, it is not anticipated that the patient will be medically stable for discharge from the hospital within 2 midnights of admission. The following factors support the patient status of inpatient.   " The patient's presenting symptoms include fall with femural fracture. " The worrisome physical exam findings include right LLE rotation. " The initial radiographic and laboratory data are worrisome because of Right proximal femural fracture. " The chronic co-morbidities include Chronic anticoagulation.   * I certify that at the point of admission it is my clinical judgment that the patient will require inpatient hospital care spanning beyond 2 midnights from the point of admission due to high intensity of service, high risk for further deterioration and high frequency of surveillance required.Lonia Blood MD [facility] Pager [phone]  If 7PM-7AM, please contact night-coverage [email or web address] Password [facility]  09/26/2017, 12:09 AM

## 2017-09-25 NOTE — ED Notes (Signed)
Pt states takes blood thinners

## 2017-09-25 NOTE — Consult Note (Signed)
ORTHOPAEDIC CONSULTATION  REQUESTING PHYSICIAN: Rometta Emery, MD  PCP:  No primary care provider on file.  Chief Complaint: Right hip pain following a fall at home  HPI: Maria Cameron is a 83 y.o. female who complains of right hip pain following a fall earlier today.  She states she slipped and fell onto her right side.  She had immediate pain and inability to bear weight on the right side.  She was in her normal state of health prior to that in which she does require the aid of a cane and occasional walker.  She lives in an assisted living facility with a roommate.  She has a remote history of cemented right total hip arthroplasty performed about 20+ years ago at [facility].  She denies smoking or diabetes.  Currently she denies any numbness, tingling or paresthesias in the right lower extremity.  She does endorse being on Coumadin for atrial fibrillation.  No past medical history on file.  Social History   Socioeconomic History  . Marital status: Widowed    Spouse name: Not on file  . Number of children: Not on file  . Years of education: Not on file  . Highest education level: Not on file  Occupational History  . Not on file  Social Needs  . Financial resource strain: Not on file  . Food insecurity:    Worry: Not on file    Inability: Not on file  . Transportation needs:    Medical: Not on file    Non-medical: Not on file  Tobacco Use  . Smoking status: Not on file  Substance and Sexual Activity  . Alcohol use: Not on file  . Drug use: Not on file  . Sexual activity: Not on file  Lifestyle  . Physical activity:    Days per week: Not on file    Minutes per session: Not on file  . Stress: Not on file  Relationships  . Social connections:    Talks on phone: Not on file    Gets together: Not on file    Attends religious service: Not on file    Active member of club or organization: Not on file    Attends meetings of clubs or organizations: Not on file      Relationship status: Not on file  Other Topics Concern  . Not on file  Social History Narrative  . Not on file   No family history on file. Allergies  Allergen Reactions  . Codeine Other (See Comments)    Pt reports allergy but not documented   Prior to Admission medications   Medication Sig Start Date End Date Taking? Authorizing Provider  acetaminophen (TYLENOL) 325 MG tablet Take 650 mg by mouth every 6 (six) hours as needed for mild pain or fever.   Yes [provider]  acetaminophen (TYLENOL) 500 MG tablet Take 1,000 mg by mouth 3 (three) times daily.   Yes [provider]  albuterol (PROVENTIL HFA;VENTOLIN HFA) 108 (90 Base) MCG/ACT inhaler Inhale 2 puffs into the lungs every 6 (six) hours as needed for wheezing or shortness of breath.   Yes [provider]  amLODipine (NORVASC) 5 MG tablet Take 5 mg by mouth daily.   Yes [provider]  ARIPiprazole (ABILIFY) 5 MG tablet Take 2.5 mg by mouth daily.   Yes [provider]  atorvastatin (LIPITOR) 20 MG tablet Take 20 mg by mouth at bedtime.   Yes [provider]  bimatoprost (  LUMIGAN) 0.01 % SOLN Place 1 drop into both eyes at bedtime.   Yes [provider]  carbamazepine (TEGRETOL) 100 MG chewable tablet Chew 100 mg by mouth 2 (two) times daily.   Yes [provider]  Carboxymethylcellulose Sod PF (REFRESH PLUS) 0.5 % SOLN Place 1 drop into both eyes daily as needed (for eye irritation).   Yes [provider]  dicyclomine (BENTYL) 10 MG capsule Take 10 mg by mouth 4 (four) times daily -  before meals and at bedtime.   Yes [provider]  diphenhydrAMINE (BENADRYL) 25 MG tablet Take 25 mg by mouth every 4 (four) hours as needed for allergies.   Yes [provider]  docusate sodium (COLACE) 100 MG capsule Take 100 mg by mouth 2 (two) times daily.   Yes [provider]  escitalopram (LEXAPRO) 10 MG tablet Take 10 mg by mouth  daily.   Yes [provider]  guaifenesin (ROBITUSSIN) 100 MG/5ML syrup Take 200 mg by mouth 4 (four) times daily as needed for cough.   Yes [provider]  levothyroxine (SYNTHROID, LEVOTHROID) 25 MCG tablet Take 25 mcg by mouth daily before breakfast.   Yes [provider]  loperamide (IMODIUM) 2 MG capsule Take 2 mg by mouth as needed for diarrhea or loose stools.   Yes [provider]  LORazepam (ATIVAN) 0.5 MG tablet Take 0.25 mg by mouth daily as needed for anxiety.   Yes [provider]  Melatonin 3 MG TABS Take 3 mg by mouth at bedtime.   Yes [provider]  mirtazapine (REMERON) 7.5 MG tablet Take 7.5 mg by mouth at bedtime.   Yes [provider]  pantoprazole (PROTONIX) 40 MG tablet Take 40 mg by mouth daily.   Yes [provider]  polyethylene glycol (MIRALAX / GLYCOLAX) packet Take 17 g by mouth daily.   Yes [provider]  simethicone (MYLICON) 80 MG chewable tablet Chew 80 mg by mouth 3 (three) times daily as needed for flatulence.   Yes [provider]  traMADol (ULTRAM) 50 MG tablet Take 50 mg by mouth every 6 (six) hours as needed for moderate pain.   Yes [provider]  warfarin (COUMADIN) 1 MG tablet Take 1.5-2 mg by mouth See admin instructions. Take 1 and 1/2 tablets on Tuesday and Thursday then take 2 tablets all the other days   Yes [provider]   Dg Chest 1 View  Result Date: 09/25/2017 CLINICAL DATA:  Unwitnessed fall preop. EXAM: CHEST  1 VIEW COMPARISON:  None. FINDINGS: Low lung volumes without acute pneumonic consolidation or CHF. No effusion. Heart size is normal with aortic atherosclerosis. Left-sided pacemaker apparatus with right atrial and right ventricular leads are noted. IMPRESSION: No active disease.  Aortic atherosclerosis. Electronically Signed   By: Tollie Eth M.D.   On: 09/25/2017 22:59   Ct Head Wo Contrast  Result Date: 09/25/2017 CLINICAL  DATA:  Unwitnessed fall in the bathroom this evening. EXAM: CT HEAD WITHOUT CONTRAST CT CERVICAL SPINE WITHOUT CONTRAST TECHNIQUE: Multidetector CT imaging of the head and cervical spine was performed following the standard protocol without intravenous contrast. Multiplanar CT image reconstructions of the cervical spine were also generated. COMPARISON:  None. FINDINGS: CT HEAD FINDINGS BRAIN: There is sulcal and ventricular prominence consistent with superficial and central atrophy. No intraparenchymal hemorrhage, mass effect nor midline shift. Periventricular and subcortical white matter hypodensities consistent with chronic small vessel ischemic disease are identified. No acute large vascular  territory infarcts. No abnormal extra-axial fluid collections. Basal cisterns are not effaced and midline. VASCULAR: Moderate calcific atherosclerosis of the carotid siphons. SKULL: No skull fracture. No significant scalp soft tissue swelling. SINUSES/ORBITS: The mastoid air-cells are clear. Right maxillary sinus atelectasis with small appearing sinus cavity completely filled with mucus. Right maxillary sinus wall thickening is also noted compatible with chronic sinusitis. Included ocular globes and orbital contents are non-suspicious. Right cataract extraction. OTHER: None. CT CERVICAL SPINE FINDINGS Alignment: Maintained cervical lordosis. Skull base and vertebrae: Osteopenia without acute cervical spine fracture. Intact skull base. Soft tissues and spinal canal: No prevertebral fluid or swelling. No visible canal hematoma. Disc levels: Marked disc space narrowing from C2 through C6 with small posterior marginal osteophytes. No jumped or perched facets. Uncovertebral joint osteoarthritis is seen bilaterally from C2 through C6. No significant central or neural foraminal encroachment. Upper chest: Apical pleuroparenchymal scarring bilaterally. Other: None IMPRESSION: 1. Atrophy with chronic white matter small vessel ischemia.  No acute intracranial abnormality. 2. Right maxillary sinus atelectasis with complete opacification of the included maxillary sinus. Given wall thickening, findings are in keeping with chronic sinusitis. 3. Cervical spondylosis with marked disc space narrowing from C2 through C6. Uncovertebral joint osteoarthritis is also noted as well as facet arthropathy. No acute cervical spine fracture. Electronically Signed   By: Tollie Eth M.D.   On: 09/25/2017 22:23   Ct Cervical Spine Wo Contrast  Result Date: 09/25/2017 CLINICAL DATA:  Unwitnessed fall in the bathroom this evening. EXAM: CT HEAD WITHOUT CONTRAST CT CERVICAL SPINE WITHOUT CONTRAST TECHNIQUE: Multidetector CT imaging of the head and cervical spine was performed following the standard protocol without intravenous contrast. Multiplanar CT image reconstructions of the cervical spine were also generated. COMPARISON:  None. FINDINGS: CT HEAD FINDINGS BRAIN: There is sulcal and ventricular prominence consistent with superficial and central atrophy. No intraparenchymal hemorrhage, mass effect nor midline shift. Periventricular and subcortical white matter hypodensities consistent with chronic small vessel ischemic disease are identified. No acute large vascular territory infarcts. No abnormal extra-axial fluid collections. Basal cisterns are not effaced and midline. VASCULAR: Moderate calcific atherosclerosis of the carotid siphons. SKULL: No skull fracture. No significant scalp soft tissue swelling. SINUSES/ORBITS: The mastoid air-cells are clear. Right maxillary sinus atelectasis with small appearing sinus cavity completely filled with mucus. Right maxillary sinus wall thickening is also noted compatible with chronic sinusitis. Included ocular globes and orbital contents are non-suspicious. Right cataract extraction. OTHER: None. CT CERVICAL SPINE FINDINGS Alignment: Maintained cervical lordosis. Skull base and vertebrae: Osteopenia without acute cervical  spine fracture. Intact skull base. Soft tissues and spinal canal: No prevertebral fluid or swelling. No visible canal hematoma. Disc levels: Marked disc space narrowing from C2 through C6 with small posterior marginal osteophytes. No jumped or perched facets. Uncovertebral joint osteoarthritis is seen bilaterally from C2 through C6. No significant central or neural foraminal encroachment. Upper chest: Apical pleuroparenchymal scarring bilaterally. Other: None IMPRESSION: 1. Atrophy with chronic white matter small vessel ischemia. No acute intracranial abnormality. 2. Right maxillary sinus atelectasis with complete opacification of the included maxillary sinus. Given wall thickening, findings are in keeping with chronic sinusitis. 3. Cervical spondylosis with marked disc space narrowing from C2 through C6. Uncovertebral joint osteoarthritis is also noted as well as facet arthropathy. No acute cervical spine fracture. Electronically Signed   By: Tollie Eth M.D.   On: 09/25/2017 22:23   Dg Hip Unilat W Or Wo Pelvis 2-3 Views Right  Result Date: 09/25/2017 CLINICAL DATA:  Unwitnessed  fall.  Right hip and leg pain. EXAM: DG HIP (WITH OR WITHOUT PELVIS) 2-3V RIGHT COMPARISON:  None. FINDINGS: No acute laterally displaced fracture of the femoral diaphysis with dorsal angulation is noted just distal to the cemented right hip arthroplasty. The fracture undermines the tip of the femoral cement. No joint dislocation is seen. There is lower lumbar degenerative disc and facet arthropathy. Bony pelvis appears intact. The native left hip joint is maintained. IMPRESSION: Acute, closed, dorsally angulated and laterally displaced fracture of the femoral diaphysis. Electronically Signed   By: Tollie Eth M.D.   On: 09/25/2017 22:49   Dg Femur Min 2 Views Right  Result Date: 09/25/2017 CLINICAL DATA:  Unwitnessed fall.  Leg pain. EXAM: RIGHT FEMUR 2 VIEWS COMPARISON:  None. FINDINGS: An acute, comminuted fracture involving the  proximal femoral diaphysis undermining the cement-bone interface of a pre-existing right hip arthroplasty is identified. There is dorsal angulation of the distal fracture fragment with internal rotation of the knee seen on the frog-leg view. There is anterior and lateral displacement 1/4 shaft width of the distal fracture fragment. IMPRESSION: Acute, comminuted fracture involving the proximal femoral diaphysis with dorsal angulation of the distal fracture fragment and internal rotation of the. 1/4 shaft width anterior and lateral displacement is also noted of the distal fracture fragment. Electronically Signed   By: Tollie Eth M.D.   On: 09/25/2017 22:55    Positive ROS: All other systems have been reviewed and were otherwise negative with the exception of those mentioned in the HPI and as above.  Physical Exam: General: Alert, no acute distress Cardiovascular: No pedal edema Respiratory: No cyanosis, no use of accessory musculature GI: No organomegaly, abdomen is soft and non-tender Skin: No lesions in the area of chief complaint Neurologic: Sensation intact distally Psychiatric: Patient is competent for consent with normal mood and affect Lymphatic: No axillary or cervical lymphadenopathy  MUSCULOSKELETAL:  Right lower extremity:  She is externally rotated but not shortened.  She has tenderness along the right thigh but compartments are soft.  Nontender at the knee.  Calf soft and nontender.  At the foot and ankle she has a good 2+ dorsalis pedis pulse.  She is able to wiggle toes and dorsiflex and plantarflex the ankle.  She endorses sensation intact light touch throughout the foot and ankle.  No pain with passive stretch.  Assessment: Right hip [place] periprosthetic fracture about a cemented total hip prosthesis.  Plan: -I discussed with Maria Cameron at length that this injury will likely need operative fixation.  We discussed the implications of that as well as likely long  recovery. -Unfortunately, she is on Coumadin and her current INR is 1.9.  For operative intervention can be considered this INR will need to be under 1.2.  -Due to her poor bone quality and the complex nature of periprosthetic fracture work I may consult with 1 of our orthopedic traumatologist to see if they can assist in this fracture care.  -We will follow along and monitor her INR for when she is appropriate for surgery.  Certainly there is no issue with her having a diet up until at least Sunday night at midnight.  -We appreciate the hospitalist service for their inpatient care.    Yolonda Kida, MD Cell [phone]    09/25/2017 11:59 PM

## 2017-09-26 ENCOUNTER — Encounter (HOSPITAL_COMMUNITY): Payer: Self-pay | Admitting: Internal Medicine

## 2017-09-26 LAB — COMPREHENSIVE METABOLIC PANEL
ALT: 26 U/L (ref 14–54)
AST: 27 U/L (ref 15–41)
Albumin: 3.2 g/dL — ABNORMAL LOW (ref 3.5–5.0)
Alkaline Phosphatase: 66 U/L (ref 38–126)
Anion gap: 9 (ref 5–15)
BUN: 15 mg/dL (ref 6–20)
CO2: 23 mmol/L (ref 22–32)
Calcium: 8.4 mg/dL — ABNORMAL LOW (ref 8.9–10.3)
Chloride: 107 mmol/L (ref 101–111)
Creatinine, Ser: 0.59 mg/dL (ref 0.44–1.00)
GFR calc Af Amer: >60 mL/min (ref >60)
GFR calc non Af Amer: >60 mL/min (ref >60)
Glucose, Bld: 151 mg/dL — ABNORMAL HIGH (ref 65–99)
Potassium: 3.6 mmol/L (ref 3.5–5.1)
Sodium: 139 mmol/L (ref 135–145)
Total Bilirubin: 0.6 mg/dL (ref 0.3–1.2)
Total Protein: 5.5 g/dL — ABNORMAL LOW (ref 6.5–8.1)

## 2017-09-26 LAB — CBC
HCT: 33 % — ABNORMAL LOW (ref 36.0–46.0)
Hemoglobin: 11 g/dL — ABNORMAL LOW (ref 12.0–15.0)
MCH: 32.9 pg (ref 26.0–34.0)
MCHC: 33.3 g/dL (ref 30.0–36.0)
MCV: 98.8 fL (ref 78.0–100.0)
Platelets: 193 10*3/uL (ref 150–400)
RBC: 3.34 MIL/uL — ABNORMAL LOW (ref 3.87–5.11)
RDW: 13.3 % (ref 11.5–15.5)
WBC: 7.8 10*3/uL (ref 4.0–10.5)

## 2017-09-26 LAB — PROTIME-INR
INR: 2.04
Prothrombin Time: 22.8 seconds — ABNORMAL HIGH (ref 11.4–15.2)

## 2017-09-26 LAB — SURGICAL PCR SCREEN
MRSA, PCR: POSITIVE — AB
Staphylococcus aureus: POSITIVE — AB

## 2017-09-26 LAB — TROPONIN I: Troponin I: <0.03 ng/mL (ref <0.03)

## 2017-09-26 MED ORDER — ACETAMINOPHEN 500 MG PO TABS
1000.0000 mg | ORAL_TABLET | Freq: Three times a day (TID) | ORAL | Status: DC
  Administered 2017-09-26 – 2017-09-29 (×11): 1000 mg via ORAL
  Filled 2017-09-26 (×12): qty 2

## 2017-09-26 MED ORDER — LEVOTHYROXINE SODIUM 25 MCG PO TABS
25.0000 ug | ORAL_TABLET | Freq: Every day | ORAL | Status: DC
  Administered 2017-09-26 – 2017-09-30 (×4): 25 ug via ORAL
  Filled 2017-09-26 (×4): qty 1

## 2017-09-26 MED ORDER — ONDANSETRON HCL 4 MG/2ML IJ SOLN: 4.0000 mg | Freq: Four times a day (QID) | INTRAMUSCULAR | Status: DC | PRN

## 2017-09-26 MED ORDER — DOCUSATE SODIUM 100 MG PO CAPS
100.0000 mg | ORAL_CAPSULE | Freq: Two times a day (BID) | ORAL | Status: DC
  Administered 2017-09-26 – 2017-09-27 (×4): 100 mg via ORAL
  Filled 2017-09-26 (×5): qty 1

## 2017-09-26 MED ORDER — ARIPIPRAZOLE 5 MG PO TABS
2.5000 mg | ORAL_TABLET | Freq: Every day | ORAL | Status: DC
  Administered 2017-09-26 – 2017-09-30 (×4): 2.5 mg via ORAL
  Filled 2017-09-26 (×4): qty 1

## 2017-09-26 MED ORDER — GUAIFENESIN 100 MG/5ML PO SYRP: 200.0000 mg | ORAL_SOLUTION | Freq: Four times a day (QID) | ORAL | Status: DC | PRN

## 2017-09-26 MED ORDER — MIRTAZAPINE 15 MG PO TABS
7.5000 mg | ORAL_TABLET | Freq: Every day | ORAL | Status: DC
  Administered 2017-09-26 – 2017-09-29 (×5): 7.5 mg via ORAL
  Filled 2017-09-26 (×5): qty 1

## 2017-09-26 MED ORDER — ACETAMINOPHEN 325 MG PO TABS
650.0000 mg | ORAL_TABLET | Freq: Four times a day (QID) | ORAL | Status: DC | PRN
  Administered 2017-09-26 – 2017-09-29 (×2): 650 mg via ORAL
  Filled 2017-09-26: qty 2

## 2017-09-26 MED ORDER — TRAMADOL HCL 50 MG PO TABS
50.0000 mg | ORAL_TABLET | Freq: Four times a day (QID) | ORAL | Status: DC | PRN
  Administered 2017-09-26 (×2): 50 mg via ORAL
  Filled 2017-09-26 (×2): qty 1

## 2017-09-26 MED ORDER — ENOXAPARIN SODIUM 40 MG/0.4ML ~~LOC~~ SOLN
40.0000 mg | SUBCUTANEOUS | Status: DC
  Administered 2017-09-26: 40 mg via SUBCUTANEOUS
  Filled 2017-09-26: qty 0.4

## 2017-09-26 MED ORDER — SIMETHICONE 80 MG PO CHEW: 80.0000 mg | CHEWABLE_TABLET | Freq: Three times a day (TID) | ORAL | Status: DC | PRN

## 2017-09-26 MED ORDER — PANTOPRAZOLE SODIUM 40 MG PO TBEC
40.0000 mg | DELAYED_RELEASE_TABLET | Freq: Every day | ORAL | Status: DC
  Administered 2017-09-26 – 2017-09-30 (×4): 40 mg via ORAL
  Filled 2017-09-26 (×4): qty 1

## 2017-09-26 MED ORDER — DICYCLOMINE HCL 10 MG PO CAPS
10.0000 mg | ORAL_CAPSULE | Freq: Three times a day (TID) | ORAL | Status: DC
  Administered 2017-09-26 – 2017-09-30 (×14): 10 mg via ORAL
  Filled 2017-09-26 (×14): qty 1

## 2017-09-26 MED ORDER — MELATONIN 3 MG PO TABS
3.0000 mg | ORAL_TABLET | Freq: Every day | ORAL | Status: DC
  Administered 2017-09-26 – 2017-09-29 (×5): 3 mg via ORAL
  Filled 2017-09-26 (×5): qty 1

## 2017-09-26 MED ORDER — MORPHINE SULFATE (PF) 2 MG/ML IV SOLN
2.0000 mg | INTRAVENOUS | Status: DC | PRN
  Administered 2017-09-28: 2 mg via INTRAVENOUS
  Filled 2017-09-26: qty 1

## 2017-09-26 MED ORDER — ATORVASTATIN CALCIUM 20 MG PO TABS
20.0000 mg | ORAL_TABLET | Freq: Every day | ORAL | Status: DC
  Administered 2017-09-26 – 2017-09-29 (×5): 20 mg via ORAL
  Filled 2017-09-26 (×5): qty 1

## 2017-09-26 MED ORDER — LORAZEPAM 0.5 MG PO TABS: 0.2500 mg | ORAL_TABLET | Freq: Every day | ORAL | Status: DC | PRN

## 2017-09-26 MED ORDER — ESCITALOPRAM OXALATE 10 MG PO TABS
10.0000 mg | ORAL_TABLET | Freq: Every day | ORAL | Status: DC
  Administered 2017-09-26 – 2017-09-30 (×4): 10 mg via ORAL
  Filled 2017-09-26 (×4): qty 1

## 2017-09-26 MED ORDER — DEXTROSE 5 % IV SOLN
5.0000 mg | Freq: Once | INTRAVENOUS | Status: AC
  Administered 2017-09-26: 5 mg via INTRAVENOUS
  Filled 2017-09-26: qty 0.5

## 2017-09-26 MED ORDER — MUPIROCIN 2 % EX OINT
1.0000 "application " | TOPICAL_OINTMENT | Freq: Two times a day (BID) | CUTANEOUS | Status: DC
  Administered 2017-09-27 – 2017-09-30 (×6): 1 via NASAL
  Filled 2017-09-26 (×2): qty 22

## 2017-09-26 MED ORDER — ACETAMINOPHEN 650 MG RE SUPP: 650.0000 mg | Freq: Four times a day (QID) | RECTAL | Status: DC | PRN

## 2017-09-26 MED ORDER — LATANOPROST 0.005 % OP SOLN
1.0000 [drp] | Freq: Every day | OPHTHALMIC | Status: DC
  Administered 2017-09-26 – 2017-09-29 (×5): 1 [drp] via OPHTHALMIC
  Filled 2017-09-26: qty 2.5

## 2017-09-26 MED ORDER — ACETAMINOPHEN 325 MG PO TABS
650.0000 mg | ORAL_TABLET | Freq: Four times a day (QID) | ORAL | Status: DC | PRN
  Filled 2017-09-26: qty 2

## 2017-09-26 MED ORDER — ONDANSETRON HCL 4 MG PO TABS: 4.0000 mg | ORAL_TABLET | Freq: Four times a day (QID) | ORAL | Status: DC | PRN

## 2017-09-26 MED ORDER — AMLODIPINE BESYLATE 5 MG PO TABS
5.0000 mg | ORAL_TABLET | Freq: Every day | ORAL | Status: DC
  Administered 2017-09-26 – 2017-09-30 (×4): 5 mg via ORAL
  Filled 2017-09-26 (×4): qty 1

## 2017-09-26 MED ORDER — LOPERAMIDE HCL 2 MG PO CAPS: 2.0000 mg | ORAL_CAPSULE | ORAL | Status: DC | PRN

## 2017-09-26 MED ORDER — CHLORHEXIDINE GLUCONATE CLOTH 2 % EX PADS
6.0000 | MEDICATED_PAD | Freq: Every day | CUTANEOUS | Status: DC
  Administered 2017-09-27 – 2017-09-30 (×3): 6 via TOPICAL

## 2017-09-26 MED ORDER — SODIUM CHLORIDE 0.9 % IV SOLN
INTRAVENOUS | Status: DC
  Administered 2017-09-26 – 2017-09-28 (×5): via INTRAVENOUS

## 2017-09-26 MED ORDER — DIPHENHYDRAMINE HCL 25 MG PO TABS: 25.0000 mg | ORAL_TABLET | ORAL | Status: DC | PRN

## 2017-09-26 MED ORDER — POLYVINYL ALCOHOL 1.4 % OP SOLN: 1.0000 [drp] | Freq: Every day | OPHTHALMIC | Status: DC | PRN

## 2017-09-26 MED ORDER — CARBAMAZEPINE 100 MG PO CHEW
100.0000 mg | CHEWABLE_TABLET | Freq: Two times a day (BID) | ORAL | Status: DC
  Administered 2017-09-26 – 2017-09-30 (×7): 100 mg via ORAL
  Filled 2017-09-26 (×10): qty 1

## 2017-09-26 MED ORDER — OXYCODONE HCL 5 MG PO TABS
5.0000 mg | ORAL_TABLET | ORAL | Status: DC | PRN
  Administered 2017-09-26 – 2017-09-27 (×5): 5 mg via ORAL
  Filled 2017-09-26 (×5): qty 1

## 2017-09-26 MED ORDER — POLYETHYLENE GLYCOL 3350 17 G PO PACK
17.0000 g | PACK | Freq: Every day | ORAL | Status: DC
  Administered 2017-09-26 – 2017-09-30 (×3): 17 g via ORAL
  Filled 2017-09-26 (×3): qty 1

## 2017-09-26 NOTE — Plan of Care (Signed)
  Problem: Activity: Goal: Risk for activity intolerance will decrease Outcome: Progressing   Problem: Elimination: Goal: Will not experience complications related to bowel motility Outcome: Progressing   Problem: Pain Managment: Goal: General experience of comfort will improve Outcome: Progressing   Problem: Safety: Goal: Ability to remain free from injury will improve Outcome: Progressing   

## 2017-09-26 NOTE — Progress Notes (Signed)
Orthopedic Tech Progress Note Patient Details:  Maria Cameron 19-Aug-1923 [id number]  Musculoskeletal Traction Type of Traction: [place] Skin Traction Traction Location: rle Traction Weight: 5 lbs   Post Interventions Patient Tolerated: Well Instructions Provided: Care of device 7 lbs is as close to the order as  could be executed because there is no 2.5lb sandbag  Nikki Dom 09/26/2017, 4:14 PM

## 2017-09-26 NOTE — Progress Notes (Signed)
Plan for operative fixation tomorrow with the Orthopedic Trauma service.  Pt is NPO tonight at [place].

## 2017-09-26 NOTE — Progress Notes (Addendum)
PROGRESS NOTE    Maria Cameron  RTM:[id number] DOB: 1923/10/04 DOA: 09/25/2017 PCP: No primary care provider on file.   Brief Narrative: Patient is a 84 year old female with past medical history of hypertension, GERD, chronic anticoagulation on warfarin, coronary artery disease who presented from assisted living facility after she sustained a fall.  Patient reported that she was walking to the bathroom in the dark, she tripped and fell with immediate pain on the right hip.  Patient was found to have a right proximal comminuted femoral fracture. Orthopedics has been consulted.  Planning for intervention but her INR is still around 2, so waiting for INR to come below 1.2.  Assessment & Plan:   Principal Problem:   Right femoral fracture (HCC) Active Problems:   Warfarin-induced coagulopathy (HCC)   CAD (coronary artery disease)   Hypothyroid   GERD (gastroesophageal reflux disease)   Depression   Right femur fracture:X ray showing Acute, comminuted fracture involving the proximal femoral diaphysis with dorsal angulation of the distal fracture fragment and internal rotation. Orthopedics following.  Waiting for INR to come below 1.2. PT/OT will be consulted after the surgery.  Elevated INR: Patient was on warfarin at home.  She will be given vitamin K 5 mg IV today.  Will check INR tomorrow.  Patient is n.p.o. after midnight. Resolution of Coumadin on this patient is questionable.  She has high risks of falls and that poses significant morbidity especially when she is on warfarin. I discussed with her daughter and the decision about resumption/stopping of warfarin will rest upon her PCP.  History of A. fib: On warfarin at home.  Warfarin held.  Hypothyroidism: Continue levothyroxine  Hypertension: Currently blood pressure stable.  Continue home regimen.  GERD: Continue PPI  Depression: Continue Lexapro  CAD: Complains of chest pain.  Continue her home meds     DVT prophylaxis:  Lovenox Code Status: Full Family Communication: Discussed with her daughter Disposition Plan: Likely SNF after surgery   Consultants: Orthopedics  Procedures: None  Antimicrobials: None  Subjective:  Patient seen and examined the bedside this morning.  Her pain is much better.  Denies any new complaints/concerns. Objective: Vitals:   09/26/17 0015 09/26/17 0054 09/26/17 0432 09/26/17 1039  BP: (!) 151/63 (!) 142/70 (!) 142/58   Pulse: 67 67 68   Resp: 15     Temp:  98.5 F (36.9 C) 98.6 F (37 C)   TempSrc:  Oral Oral   SpO2: 95% 98% 96%   Weight:    57.8 kg (127 lb 6.8 oz)  Height:    5\' 5"  (1.651 m)   No intake or output data in the 24 hours ending 09/26/17 1254 Filed Weights   09/26/17 1039  Weight: 57.8 kg (127 lb 6.8 oz)    Examination:  General exam: Appears calm and comfortable ,Not in distress, elderly female. HEENT:PERRL,Oral mucosa moist, Ear/Nose normal on gross exam Respiratory system: Bilateral equal air entry, normal vesicular breath sounds, no wheezes or crackles  Cardiovascular system: S1 & S2 heard, RRR. No JVD, murmurs, rubs, gallops or clicks. No pedal edema. Gastrointestinal system: Abdomen is nondistended, soft and nontender. No organomegaly or masses felt. Normal bowel sounds heard. Central nervous system: Alert and oriented. No focal neurological deficits. Extremities: No edema, no clubbing ,no cyanosis, distal peripheral pulses palpable.  Severe tenderness on the right hip Skin: No rashes, lesions or ulcers,no icterus ,no pallor MSK: Normal muscle bulk,tone ,power Psychiatry: Judgement and insight appear normal. Mood & affect appropriate.  Data Reviewed: I have personally reviewed following labs and imaging studies  CBC: Recent Labs  Lab 09/25/17 2140 09/26/17 0624  WBC 7.9 7.8  NEUTROABS 4.4  --   HGB 12.6 11.0*  HCT 38.1 33.0*  MCV 99.2 98.8  PLT 242 193   Basic Metabolic Panel: Recent Labs  Lab 09/25/17 2140  09/26/17 0624  NA 138 139  K 3.8 3.6  CL 104 107  CO2 25 23  GLUCOSE 120* 151*  BUN 17 15  CREATININE 0.61 0.59  CALCIUM 9.0 8.4*   GFR: Estimated Creatinine Clearance: 41.2 mL/min (by C-G formula based on SCr of 0.59 mg/dL). Liver Function Tests: Recent Labs  Lab 09/26/17 0624  AST 27  ALT 26  ALKPHOS 66  BILITOT 0.6  PROT 5.5*  ALBUMIN 3.2*   No results for input(s): LIPASE, AMYLASE in the last 168 hours. No results for input(s): AMMONIA in the last 168 hours. Coagulation Profile: Recent Labs  Lab 09/25/17 2313 09/26/17 0850  INR 1.91 2.04   Cardiac Enzymes: Recent Labs  Lab 09/26/17 0624  TROPONINI <0.03   BNP (last 3 results) No results for input(s): PROBNP in the last 8760 hours. HbA1C: No results for input(s): HGBA1C in the last 72 hours. CBG: No results for input(s): GLUCAP in the last 168 hours. Lipid Profile: No results for input(s): CHOL, HDL, LDLCALC, TRIG, CHOLHDL, LDLDIRECT in the last 72 hours. Thyroid Function Tests: No results for input(s): TSH, T4TOTAL, FREET4, T3FREE, THYROIDAB in the last 72 hours. Anemia Panel: No results for input(s): VITAMINB12, FOLATE, FERRITIN, TIBC, IRON, RETICCTPCT in the last 72 hours. Sepsis Labs: No results for input(s): PROCALCITON, LATICACIDVEN in the last 168 hours.  No results found for this or any previous visit (from the past 240 hour(s)).       Radiology Studies: Dg Chest 1 View  Result Date: 09/25/2017 CLINICAL DATA:  Unwitnessed fall preop. EXAM: CHEST  1 VIEW COMPARISON:  None. FINDINGS: Low lung volumes without acute pneumonic consolidation or CHF. No effusion. Heart size is normal with aortic atherosclerosis. Left-sided pacemaker apparatus with right atrial and right ventricular leads are noted. IMPRESSION: No active disease.  Aortic atherosclerosis. Electronically Signed   By: Tollie Eth M.D.   On: 09/25/2017 22:59   Ct Head Wo Contrast  Result Date: 09/25/2017 CLINICAL DATA:  Unwitnessed  fall in the bathroom this evening. EXAM: CT HEAD WITHOUT CONTRAST CT CERVICAL SPINE WITHOUT CONTRAST TECHNIQUE: Multidetector CT imaging of the head and cervical spine was performed following the standard protocol without intravenous contrast. Multiplanar CT image reconstructions of the cervical spine were also generated. COMPARISON:  None. FINDINGS: CT HEAD FINDINGS BRAIN: There is sulcal and ventricular prominence consistent with superficial and central atrophy. No intraparenchymal hemorrhage, mass effect nor midline shift. Periventricular and subcortical white matter hypodensities consistent with chronic small vessel ischemic disease are identified. No acute large vascular territory infarcts. No abnormal extra-axial fluid collections. Basal cisterns are not effaced and midline. VASCULAR: Moderate calcific atherosclerosis of the carotid siphons. SKULL: No skull fracture. No significant scalp soft tissue swelling. SINUSES/ORBITS: The mastoid air-cells are clear. Right maxillary sinus atelectasis with small appearing sinus cavity completely filled with mucus. Right maxillary sinus wall thickening is also noted compatible with chronic sinusitis. Included ocular globes and orbital contents are non-suspicious. Right cataract extraction. OTHER: None. CT CERVICAL SPINE FINDINGS Alignment: Maintained cervical lordosis. Skull base and vertebrae: Osteopenia without acute cervical spine fracture. Intact skull base. Soft tissues and spinal canal: No prevertebral fluid or  swelling. No visible canal hematoma. Disc levels: Marked disc space narrowing from C2 through C6 with small posterior marginal osteophytes. No jumped or perched facets. Uncovertebral joint osteoarthritis is seen bilaterally from C2 through C6. No significant central or neural foraminal encroachment. Upper chest: Apical pleuroparenchymal scarring bilaterally. Other: None IMPRESSION: 1. Atrophy with chronic white matter small vessel ischemia. No acute  intracranial abnormality. 2. Right maxillary sinus atelectasis with complete opacification of the included maxillary sinus. Given wall thickening, findings are in keeping with chronic sinusitis. 3. Cervical spondylosis with marked disc space narrowing from C2 through C6. Uncovertebral joint osteoarthritis is also noted as well as facet arthropathy. No acute cervical spine fracture. Electronically Signed   By: Tollie Eth M.D.   On: 09/25/2017 22:23   Ct Cervical Spine Wo Contrast  Result Date: 09/25/2017 CLINICAL DATA:  Unwitnessed fall in the bathroom this evening. EXAM: CT HEAD WITHOUT CONTRAST CT CERVICAL SPINE WITHOUT CONTRAST TECHNIQUE: Multidetector CT imaging of the head and cervical spine was performed following the standard protocol without intravenous contrast. Multiplanar CT image reconstructions of the cervical spine were also generated. COMPARISON:  None. FINDINGS: CT HEAD FINDINGS BRAIN: There is sulcal and ventricular prominence consistent with superficial and central atrophy. No intraparenchymal hemorrhage, mass effect nor midline shift. Periventricular and subcortical white matter hypodensities consistent with chronic small vessel ischemic disease are identified. No acute large vascular territory infarcts. No abnormal extra-axial fluid collections. Basal cisterns are not effaced and midline. VASCULAR: Moderate calcific atherosclerosis of the carotid siphons. SKULL: No skull fracture. No significant scalp soft tissue swelling. SINUSES/ORBITS: The mastoid air-cells are clear. Right maxillary sinus atelectasis with small appearing sinus cavity completely filled with mucus. Right maxillary sinus wall thickening is also noted compatible with chronic sinusitis. Included ocular globes and orbital contents are non-suspicious. Right cataract extraction. OTHER: None. CT CERVICAL SPINE FINDINGS Alignment: Maintained cervical lordosis. Skull base and vertebrae: Osteopenia without acute cervical spine  fracture. Intact skull base. Soft tissues and spinal canal: No prevertebral fluid or swelling. No visible canal hematoma. Disc levels: Marked disc space narrowing from C2 through C6 with small posterior marginal osteophytes. No jumped or perched facets. Uncovertebral joint osteoarthritis is seen bilaterally from C2 through C6. No significant central or neural foraminal encroachment. Upper chest: Apical pleuroparenchymal scarring bilaterally. Other: None IMPRESSION: 1. Atrophy with chronic white matter small vessel ischemia. No acute intracranial abnormality. 2. Right maxillary sinus atelectasis with complete opacification of the included maxillary sinus. Given wall thickening, findings are in keeping with chronic sinusitis. 3. Cervical spondylosis with marked disc space narrowing from C2 through C6. Uncovertebral joint osteoarthritis is also noted as well as facet arthropathy. No acute cervical spine fracture. Electronically Signed   By: Tollie Eth M.D.   On: 09/25/2017 22:23   Dg Hip Unilat W Or Wo Pelvis 2-3 Views Right  Result Date: 09/25/2017 CLINICAL DATA:  Unwitnessed fall.  Right hip and leg pain. EXAM: DG HIP (WITH OR WITHOUT PELVIS) 2-3V RIGHT COMPARISON:  None. FINDINGS: No acute laterally displaced fracture of the femoral diaphysis with dorsal angulation is noted just distal to the cemented right hip arthroplasty. The fracture undermines the tip of the femoral cement. No joint dislocation is seen. There is lower lumbar degenerative disc and facet arthropathy. Bony pelvis appears intact. The native left hip joint is maintained. IMPRESSION: Acute, closed, dorsally angulated and laterally displaced fracture of the femoral diaphysis. Electronically Signed   By: Tollie Eth M.D.   On: 09/25/2017 22:49   Dg  Femur Min 2 Views Right  Result Date: 09/25/2017 CLINICAL DATA:  Unwitnessed fall.  Leg pain. EXAM: RIGHT FEMUR 2 VIEWS COMPARISON:  None. FINDINGS: An acute, comminuted fracture involving the  proximal femoral diaphysis undermining the cement-bone interface of a pre-existing right hip arthroplasty is identified. There is dorsal angulation of the distal fracture fragment with internal rotation of the knee seen on the frog-leg view. There is anterior and lateral displacement 1/4 shaft width of the distal fracture fragment. IMPRESSION: Acute, comminuted fracture involving the proximal femoral diaphysis with dorsal angulation of the distal fracture fragment and internal rotation of the. 1/4 shaft width anterior and lateral displacement is also noted of the distal fracture fragment. Electronically Signed   By: Tollie Eth M.D.   On: 09/25/2017 22:55        Scheduled Meds: . acetaminophen  1,000 mg Oral TID  . amLODipine  5 mg Oral Daily  . ARIPiprazole  2.5 mg Oral Daily  . atorvastatin  20 mg Oral QHS  . carbamazepine  100 mg Oral BID  . dicyclomine  10 mg Oral TID AC & HS  . docusate sodium  100 mg Oral BID  . enoxaparin (LOVENOX) injection  40 mg Subcutaneous Q24H  . escitalopram  10 mg Oral Daily  . latanoprost  1 drop Both Eyes QHS  . levothyroxine  25 mcg Oral QAC breakfast  . Melatonin  3 mg Oral QHS  . mirtazapine  7.5 mg Oral QHS  . pantoprazole  40 mg Oral Daily  . polyethylene glycol  17 g Oral Daily   Continuous Infusions: . sodium chloride 75 mL/hr at 09/26/17 0105  . phytonadione (VITAMIN K) IV       LOS: 1 day    Time spent: 25 mins.More than 50% of that time was spent in counseling and/or coordination of care.      Burnadette Pop, MD [facility] Pager [phone]  If 7PM-7AM, please contact night-coverage [email or web address] Password [facility] 09/26/2017, 12:54 PM

## 2017-09-26 NOTE — Progress Notes (Signed)
Orthopedic Tech Progress Note Patient Details:  Maria Cameron 25-Oct-1923 [id number]  Patient ID: Maria Cameron, female   DOB: 1924-01-05, 84 y.o.   MRN: [id number] I asked the RN to get clarification for weight on bucks traction. When I came to apply it I was told that they wanted to wait till another dr was consulted and I would get a new call.  Maria Cameron 09/26/2017, 5:07 AM

## 2017-09-26 NOTE — Progress Notes (Signed)
Ortho tech was not able to apply Bucks Traction to the patient due to no specific amount of weight was ordered. Orthopedic MD to consult for the clarification of the order.

## 2017-09-27 LAB — PROTIME-INR
INR: 1.26
INR: 1.15
Prothrombin Time: 15.7 seconds — ABNORMAL HIGH (ref 11.4–15.2)
Prothrombin Time: 14.6 seconds (ref 11.4–15.2)

## 2017-09-27 MED ORDER — VANCOMYCIN HCL IN DEXTROSE 1-5 GM/200ML-% IV SOLN
1000.0000 mg | Freq: Once | INTRAVENOUS | Status: DC
  Filled 2017-09-27: qty 200

## 2017-09-27 NOTE — Progress Notes (Addendum)
I have contacted Dr. Magdalene Patricia office x2 and left messages inquiring about orders for patient's surgery tomorrow. No message has been returned and no orders have been placed. Patients surgery is set for 7:45am on 09/28/17. Will pass on message to night shift nurse

## 2017-09-27 NOTE — Progress Notes (Signed)
PROGRESS NOTE    Maria Cameron  ZGY:[id number] DOB: 12-23-1923 DOA: 09/25/2017 PCP: No primary care provider on file.   Brief Narrative: Patient is a 84 year old female with past medical history of hypertension, GERD, chronic anticoagulation on warfarin, coronary artery disease who presented from assisted living facility after she sustained a fall.  Patient reported that she was walking to the bathroom in the dark, she tripped and fell with immediate pain on the right hip.  Patient was found to have a right proximal comminuted femoral fracture. Orthopedics has been consulted.  Planning for intervention but her INR was high , so waiting for INR to come below 1.2.  Assessment & Plan:   Principal Problem:   Right femoral fracture (HCC) Active Problems:   Warfarin-induced coagulopathy (HCC)   CAD (coronary artery disease)   Hypothyroid   GERD (gastroesophageal reflux disease)   Depression   Right femur fracture:X ray showing Acute, comminuted fracture involving the proximal femoral diaphysis with dorsal angulation of the distal fracture fragment and internal rotation. Orthopedics following.  Waiting for INR to come below 1.2. PT/OT will be consulted after the surgery.  Elevated INR: Patient was on warfarin at home.  She will be given vitamin K 5 mg IV on 09/26/17. INR 1.24 today.  Patient was n.p.o. after midnight. Resolution of Coumadin on this patient is questionable.  She has high risks of falls and that poses significant morbidity especially when she is on warfarin. I discussed with her daughter and the decision about resumption/stopping of warfarin will rest upon her PCP.  History of A. fib: On warfarin at home.  Warfarin held.  Hypothyroidism: Continue levothyroxine  Hypertension: Currently blood pressure stable.  Continue home regimen.  GERD: Continue PPI  Depression: Continue Lexapro  CAD: Complains of chest pain.  Continue her home meds     DVT prophylaxis: Lovenox Code  Status: Full Family Communication: None on the bed side today Disposition Plan: Likely SNF after surgery   Consultants: Orthopedics  Procedures: None  Antimicrobials: None  Subjective:  Patient seen and examined his morning.  She is comfortable.  Pain is better.  Waiting for surgery .  objective: Vitals:   09/26/17 1039 09/26/17 1321 09/26/17 1952 09/27/17 0544  BP:  (!) 100/42 137/60   Pulse:  61 64 62  Resp:  16 16 16   Temp:  98.9 F (37.2 C) 98.1 F (36.7 C) 97.8 F (36.6 C)  TempSrc:  Oral Oral Oral  SpO2:  95% 96% 97%  Weight: 57.8 kg (127 lb 6.8 oz)     Height: 5\' 5"  (1.651 m)       Intake/Output Summary (Last 24 hours) at 09/27/2017 1204 Last data filed at 09/27/2017 09/29/2017 Gross per 24 hour  Intake 2060 ml  Output -  Net 2060 ml   Filed Weights   09/26/17 1039  Weight: 57.8 kg (127 lb 6.8 oz)    Examination:  General exam: Appears calm and comfortable ,Not in distress,average built,elderly female HEENT:PERRL,Oral mucosa moist, Ear/Nose normal on gross exam Respiratory system: Bilateral equal air entry, normal vesicular breath sounds, no wheezes or crackles  Cardiovascular system: S1 & S2 heard, RRR. No JVD, murmurs, rubs, gallops or clicks. Gastrointestinal system: Abdomen is nondistended, soft and nontender. No organomegaly or masses felt. Normal bowel sounds heard. Central nervous system: Alert and oriented. No focal neurological deficits. Extremities: No edema, no clubbing ,no cyanosis, distal peripheral pulses palpable, tenderness on the right hip Skin: No rashes, lesions or ulcers,no icterus ,no  pallor MSK: Normal muscle bulk,tone ,power Psychiatry: Judgement and insight appear normal. Mood & affect appropriate.       Data Reviewed: I have personally reviewed following labs and imaging studies  CBC: Recent Labs  Lab 09/25/17 2140 09/26/17 0624  WBC 7.9 7.8  NEUTROABS 4.4  --   HGB 12.6 11.0*  HCT 38.1 33.0*  MCV 99.2 98.8  PLT 242 193     Basic Metabolic Panel: Recent Labs  Lab 09/25/17 2140 09/26/17 0624  NA 138 139  K 3.8 3.6  CL 104 107  CO2 25 23  GLUCOSE 120* 151*  BUN 17 15  CREATININE 0.61 0.59  CALCIUM 9.0 8.4*   GFR: Estimated Creatinine Clearance: 41.2 mL/min (by C-G formula based on SCr of 0.59 mg/dL). Liver Function Tests: Recent Labs  Lab 09/26/17 0624  AST 27  ALT 26  ALKPHOS 66  BILITOT 0.6  PROT 5.5*  ALBUMIN 3.2*   No results for input(s): LIPASE, AMYLASE in the last 168 hours. No results for input(s): AMMONIA in the last 168 hours. Coagulation Profile: Recent Labs  Lab 09/25/17 2313 09/26/17 0850 09/27/17 0635  INR 1.91 2.04 1.26   Cardiac Enzymes: Recent Labs  Lab 09/26/17 0624  TROPONINI <0.03   BNP (last 3 results) No results for input(s): PROBNP in the last 8760 hours. HbA1C: No results for input(s): HGBA1C in the last 72 hours. CBG: No results for input(s): GLUCAP in the last 168 hours. Lipid Profile: No results for input(s): CHOL, HDL, LDLCALC, TRIG, CHOLHDL, LDLDIRECT in the last 72 hours. Thyroid Function Tests: No results for input(s): TSH, T4TOTAL, FREET4, T3FREE, THYROIDAB in the last 72 hours. Anemia Panel: No results for input(s): VITAMINB12, FOLATE, FERRITIN, TIBC, IRON, RETICCTPCT in the last 72 hours. Sepsis Labs: No results for input(s): PROCALCITON, LATICACIDVEN in the last 168 hours.  Recent Results (from the past 240 hour(s))  Surgical pcr screen     Status: Abnormal   Collection Time: 09/26/17 12:13 PM  Result Value Ref Range Status   MRSA, PCR POSITIVE (A) NEGATIVE Final   Staphylococcus aureus POSITIVE (A) NEGATIVE Final    Comment: RESULT CALLED TO, READ BACK BY AND VERIFIED WITH: R ARCILLA,RN AT 1641 09/26/17 BY L BENFIELD (NOTE) The Xpert SA Assay (FDA approved for NASAL specimens in patients 14 years of age and older), is one component of a comprehensive surveillance program. It is not intended to diagnose infection nor to guide or  monitor treatment. Performed at [facility], [address], [place], [place] [address]          Radiology Studies: Dg Chest 1 View  Result Date: 09/25/2017 CLINICAL DATA:  Unwitnessed fall preop. EXAM: CHEST  1 VIEW COMPARISON:  None. FINDINGS: Low lung volumes without acute pneumonic consolidation or CHF. No effusion. Heart size is normal with aortic atherosclerosis. Left-sided pacemaker apparatus with right atrial and right ventricular leads are noted. IMPRESSION: No active disease.  Aortic atherosclerosis. Electronically Signed   By: Tollie Eth M.D.   On: 09/25/2017 22:59   Ct Head Wo Contrast  Result Date: 09/25/2017 CLINICAL DATA:  Unwitnessed fall in the bathroom this evening. EXAM: CT HEAD WITHOUT CONTRAST CT CERVICAL SPINE WITHOUT CONTRAST TECHNIQUE: Multidetector CT imaging of the head and cervical spine was performed following the standard protocol without intravenous contrast. Multiplanar CT image reconstructions of the cervical spine were also generated. COMPARISON:  None. FINDINGS: CT HEAD FINDINGS BRAIN: There is sulcal and ventricular prominence consistent with superficial and central  atrophy. No intraparenchymal hemorrhage, mass effect nor midline shift. Periventricular and subcortical white matter hypodensities consistent with chronic small vessel ischemic disease are identified. No acute large vascular territory infarcts. No abnormal extra-axial fluid collections. Basal cisterns are not effaced and midline. VASCULAR: Moderate calcific atherosclerosis of the carotid siphons. SKULL: No skull fracture. No significant scalp soft tissue swelling. SINUSES/ORBITS: The mastoid air-cells are clear. Right maxillary sinus atelectasis with small appearing sinus cavity completely filled with mucus. Right maxillary sinus wall thickening is also noted compatible with chronic sinusitis. Included ocular globes and orbital contents are non-suspicious. Right cataract extraction. OTHER: None.  CT CERVICAL SPINE FINDINGS Alignment: Maintained cervical lordosis. Skull base and vertebrae: Osteopenia without acute cervical spine fracture. Intact skull base. Soft tissues and spinal canal: No prevertebral fluid or swelling. No visible canal hematoma. Disc levels: Marked disc space narrowing from C2 through C6 with small posterior marginal osteophytes. No jumped or perched facets. Uncovertebral joint osteoarthritis is seen bilaterally from C2 through C6. No significant central or neural foraminal encroachment. Upper chest: Apical pleuroparenchymal scarring bilaterally. Other: None IMPRESSION: 1. Atrophy with chronic white matter small vessel ischemia. No acute intracranial abnormality. 2. Right maxillary sinus atelectasis with complete opacification of the included maxillary sinus. Given wall thickening, findings are in keeping with chronic sinusitis. 3. Cervical spondylosis with marked disc space narrowing from C2 through C6. Uncovertebral joint osteoarthritis is also noted as well as facet arthropathy. No acute cervical spine fracture. Electronically Signed   By: Tollie Eth M.D.   On: 09/25/2017 22:23   Ct Cervical Spine Wo Contrast  Result Date: 09/25/2017 CLINICAL DATA:  Unwitnessed fall in the bathroom this evening. EXAM: CT HEAD WITHOUT CONTRAST CT CERVICAL SPINE WITHOUT CONTRAST TECHNIQUE: Multidetector CT imaging of the head and cervical spine was performed following the standard protocol without intravenous contrast. Multiplanar CT image reconstructions of the cervical spine were also generated. COMPARISON:  None. FINDINGS: CT HEAD FINDINGS BRAIN: There is sulcal and ventricular prominence consistent with superficial and central atrophy. No intraparenchymal hemorrhage, mass effect nor midline shift. Periventricular and subcortical white matter hypodensities consistent with chronic small vessel ischemic disease are identified. No acute large vascular territory infarcts. No abnormal extra-axial fluid  collections. Basal cisterns are not effaced and midline. VASCULAR: Moderate calcific atherosclerosis of the carotid siphons. SKULL: No skull fracture. No significant scalp soft tissue swelling. SINUSES/ORBITS: The mastoid air-cells are clear. Right maxillary sinus atelectasis with small appearing sinus cavity completely filled with mucus. Right maxillary sinus wall thickening is also noted compatible with chronic sinusitis. Included ocular globes and orbital contents are non-suspicious. Right cataract extraction. OTHER: None. CT CERVICAL SPINE FINDINGS Alignment: Maintained cervical lordosis. Skull base and vertebrae: Osteopenia without acute cervical spine fracture. Intact skull base. Soft tissues and spinal canal: No prevertebral fluid or swelling. No visible canal hematoma. Disc levels: Marked disc space narrowing from C2 through C6 with small posterior marginal osteophytes. No jumped or perched facets. Uncovertebral joint osteoarthritis is seen bilaterally from C2 through C6. No significant central or neural foraminal encroachment. Upper chest: Apical pleuroparenchymal scarring bilaterally. Other: None IMPRESSION: 1. Atrophy with chronic white matter small vessel ischemia. No acute intracranial abnormality. 2. Right maxillary sinus atelectasis with complete opacification of the included maxillary sinus. Given wall thickening, findings are in keeping with chronic sinusitis. 3. Cervical spondylosis with marked disc space narrowing from C2 through C6. Uncovertebral joint osteoarthritis is also noted as well as facet arthropathy. No acute cervical spine fracture. Electronically Signed   By: Onalee Hua  Sterling Big M.D.   On: 09/25/2017 22:23   Dg Hip Unilat W Or Wo Pelvis 2-3 Views Right  Result Date: 09/25/2017 CLINICAL DATA:  Unwitnessed fall.  Right hip and leg pain. EXAM: DG HIP (WITH OR WITHOUT PELVIS) 2-3V RIGHT COMPARISON:  None. FINDINGS: No acute laterally displaced fracture of the femoral diaphysis with dorsal  angulation is noted just distal to the cemented right hip arthroplasty. The fracture undermines the tip of the femoral cement. No joint dislocation is seen. There is lower lumbar degenerative disc and facet arthropathy. Bony pelvis appears intact. The native left hip joint is maintained. IMPRESSION: Acute, closed, dorsally angulated and laterally displaced fracture of the femoral diaphysis. Electronically Signed   By: Tollie Eth M.D.   On: 09/25/2017 22:49   Dg Femur Min 2 Views Right  Result Date: 09/25/2017 CLINICAL DATA:  Unwitnessed fall.  Leg pain. EXAM: RIGHT FEMUR 2 VIEWS COMPARISON:  None. FINDINGS: An acute, comminuted fracture involving the proximal femoral diaphysis undermining the cement-bone interface of a pre-existing right hip arthroplasty is identified. There is dorsal angulation of the distal fracture fragment with internal rotation of the knee seen on the frog-leg view. There is anterior and lateral displacement 1/4 shaft width of the distal fracture fragment. IMPRESSION: Acute, comminuted fracture involving the proximal femoral diaphysis with dorsal angulation of the distal fracture fragment and internal rotation of the. 1/4 shaft width anterior and lateral displacement is also noted of the distal fracture fragment. Electronically Signed   By: Tollie Eth M.D.   On: 09/25/2017 22:55        Scheduled Meds: . acetaminophen  1,000 mg Oral TID  . amLODipine  5 mg Oral Daily  . ARIPiprazole  2.5 mg Oral Daily  . atorvastatin  20 mg Oral QHS  . carbamazepine  100 mg Oral BID  . Chlorhexidine Gluconate Cloth  6 each Topical Q0600  . dicyclomine  10 mg Oral TID AC & HS  . docusate sodium  100 mg Oral BID  . enoxaparin (LOVENOX) injection  40 mg Subcutaneous Q24H  . escitalopram  10 mg Oral Daily  . latanoprost  1 drop Both Eyes QHS  . levothyroxine  25 mcg Oral QAC breakfast  . Melatonin  3 mg Oral QHS  . mirtazapine  7.5 mg Oral QHS  . mupirocin ointment  1 application Nasal  BID  . pantoprazole  40 mg Oral Daily  . polyethylene glycol  17 g Oral Daily   Continuous Infusions: . sodium chloride 75 mL/hr at 09/26/17 1331     LOS: 2 days    Time spent: 25 mins.More than 50% of that time was spent in counseling and/or coordination of care.      Burnadette Pop, MD [facility] Pager [phone]  If 7PM-7AM, please contact night-coverage [email or web address] Password [facility] 09/27/2017, 12:04 PM

## 2017-09-27 NOTE — Anesthesia Preprocedure Evaluation (Addendum)
Anesthesia Evaluation  Patient identified by MRN, date of birth, ID band Patient awake    Reviewed: Allergy & Precautions, NPO status , Patient's Chart, lab work & pertinent test results  Airway Mallampati: II  TM Distance: >3 FB Neck ROM: Full    Dental no notable dental hx. (+) Dental Advidsory Given, Upper Dentures, Edentulous Lower   Pulmonary COPD,  COPD inhaler,    Pulmonary exam normal breath sounds clear to auscultation       Cardiovascular hypertension, Pt. on medications + CAD  Normal cardiovascular exam+ pacemaker  Rhythm:Regular Rate:Normal     Neuro/Psych PSYCHIATRIC DISORDERS Depression negative neurological ROS     GI/Hepatic Neg liver ROS, GERD  Medicated,  Endo/Other  Hypothyroidism   Renal/GU negative Renal ROS  negative genitourinary   Musculoskeletal negative musculoskeletal ROS (+)   Abdominal   Peds negative pediatric ROS (+)  Hematology  (+) anemia ,   Anesthesia Other Findings   Reproductive/Obstetrics negative OB ROS                            Anesthesia Physical Anesthesia Plan  ASA: III  Anesthesia Plan: General   Post-op Pain Management:    Induction: Intravenous  PONV Risk Score and Plan: 3 and Ondansetron and Treatment may vary due to age or medical condition  Airway Management Planned: LMA and Oral ETT  Additional Equipment:   Intra-op Plan:   Post-operative Plan: Extubation in OR  Informed Consent: I have reviewed the patients History and Physical, chart, labs and discussed the procedure including the risks, benefits and alternatives for the proposed anesthesia with the patient or authorized representative who has indicated his/her understanding and acceptance.   Dental Advisory Given  Plan Discussed with: CRNA, Surgeon and Anesthesiologist  Anesthesia Plan Comments: ( )       Anesthesia Quick Evaluation

## 2017-09-27 NOTE — Progress Notes (Signed)
Left message on POA's voice mail to obtain consent for scheduled surgery; waiting for reply as of this writing

## 2017-09-27 NOTE — Progress Notes (Signed)
Left message in OR with Enrique Sack for Dr. Carola Frost about surgery orders needed for patient.

## 2017-09-28 ENCOUNTER — Encounter (HOSPITAL_COMMUNITY)
Admission: EM | Disposition: A | Discharge status: Skilled Nursing Facility | Payer: Self-pay | Source: Home / Self Care | Attending: Internal Medicine

## 2017-09-28 ENCOUNTER — Inpatient Hospital Stay (HOSPITAL_COMMUNITY): Payer: Medicare Other | Admitting: Certified Registered"

## 2017-09-28 ENCOUNTER — Inpatient Hospital Stay (HOSPITAL_COMMUNITY): Payer: Medicare Other

## 2017-09-28 DIAGNOSIS — S72351A Displaced comminuted fracture of shaft of right femur, initial encounter for closed fracture: Secondary | ICD-10-CM

## 2017-09-28 HISTORY — DX: Displaced comminuted fracture of shaft of right femur, initial encounter for closed fracture: S72.351A

## 2017-09-28 HISTORY — PX: ORIF FEMUR FRACTURE: SHX2119

## 2017-09-28 LAB — CBC
HCT: 31.1 % — ABNORMAL LOW (ref 36.0–46.0)
Hemoglobin: 10.2 g/dL — ABNORMAL LOW (ref 12.0–15.0)
MCH: 32.8 pg (ref 26.0–34.0)
MCHC: 32.8 g/dL (ref 30.0–36.0)
MCV: 100 fL (ref 78.0–100.0)
Platelets: 178 10*3/uL (ref 150–400)
RBC: 3.11 MIL/uL — ABNORMAL LOW (ref 3.87–5.11)
RDW: 13.5 % (ref 11.5–15.5)
WBC: 8.2 10*3/uL (ref 4.0–10.5)

## 2017-09-28 LAB — CREATININE, SERUM
Creatinine, Ser: 0.63 mg/dL (ref 0.44–1.00)
GFR calc Af Amer: >60 mL/min (ref >60)
GFR calc non Af Amer: >60 mL/min (ref >60)

## 2017-09-28 LAB — PROTIME-INR
INR: 1.16
INR: 1.12
Prothrombin Time: 14.7 seconds (ref 11.4–15.2)
Prothrombin Time: 14.3 seconds (ref 11.4–15.2)

## 2017-09-28 SURGERY — OPEN REDUCTION INTERNAL FIXATION FEMORAL SHAFT FRACTURE
Anesthesia: General | Laterality: Right

## 2017-09-28 MED ORDER — VANCOMYCIN HCL IN DEXTROSE 1-5 GM/200ML-% IV SOLN
1000.0000 mg | Freq: Two times a day (BID) | INTRAVENOUS | Status: AC
  Administered 2017-09-28: 1000 mg via INTRAVENOUS
  Filled 2017-09-28: qty 200

## 2017-09-28 MED ORDER — HYDROCODONE-ACETAMINOPHEN 5-325 MG PO TABS: 1.0000 | ORAL_TABLET | ORAL | Status: DC | PRN

## 2017-09-28 MED ORDER — LACTATED RINGERS IV SOLN
INTRAVENOUS | Status: DC | PRN
  Administered 2017-09-28 (×2): via INTRAVENOUS

## 2017-09-28 MED ORDER — MORPHINE SULFATE (PF) 2 MG/ML IV SOLN: 0.5000 mg | INTRAVENOUS | Status: DC | PRN

## 2017-09-28 MED ORDER — MEPERIDINE HCL 50 MG/ML IJ SOLN: 6.2500 mg | INTRAMUSCULAR | Status: DC | PRN

## 2017-09-28 MED ORDER — PROPOFOL 10 MG/ML IV BOLUS
INTRAVENOUS | Status: DC | PRN
  Administered 2017-09-28: 100 mg via INTRAVENOUS

## 2017-09-28 MED ORDER — ONDANSETRON HCL 4 MG/2ML IJ SOLN: 4.0000 mg | Freq: Four times a day (QID) | INTRAMUSCULAR | Status: DC | PRN

## 2017-09-28 MED ORDER — FENTANYL CITRATE (PF) 100 MCG/2ML IJ SOLN: 25.0000 ug | INTRAMUSCULAR | Status: DC | PRN

## 2017-09-28 MED ORDER — LIDOCAINE HCL (CARDIAC) 20 MG/ML IV SOLN
INTRAVENOUS | Status: AC
  Filled 2017-09-28: qty 10

## 2017-09-28 MED ORDER — FENTANYL CITRATE (PF) 100 MCG/2ML IJ SOLN
INTRAMUSCULAR | Status: DC | PRN
  Administered 2017-09-28 (×4): 50 ug via INTRAVENOUS

## 2017-09-28 MED ORDER — FENTANYL CITRATE (PF) 250 MCG/5ML IJ SOLN
INTRAMUSCULAR | Status: AC
  Filled 2017-09-28: qty 5

## 2017-09-28 MED ORDER — ACETAMINOPHEN 500 MG PO TABS: 500.0000 mg | ORAL_TABLET | Freq: Four times a day (QID) | ORAL | Status: DC

## 2017-09-28 MED ORDER — DOCUSATE SODIUM 100 MG PO CAPS
100.0000 mg | ORAL_CAPSULE | Freq: Two times a day (BID) | ORAL | Status: DC
  Administered 2017-09-28 – 2017-09-30 (×4): 100 mg via ORAL
  Filled 2017-09-28 (×4): qty 1

## 2017-09-28 MED ORDER — WARFARIN SODIUM 3 MG PO TABS
3.0000 mg | ORAL_TABLET | Freq: Once | ORAL | Status: AC
  Administered 2017-09-28: 3 mg via ORAL
  Filled 2017-09-28: qty 1

## 2017-09-28 MED ORDER — WARFARIN - PHARMACIST DOSING INPATIENT
Freq: Every day | Status: DC
  Administered 2017-09-28: 18:00:00

## 2017-09-28 MED ORDER — 0.9 % SODIUM CHLORIDE (POUR BTL) OPTIME
TOPICAL | Status: DC | PRN
  Administered 2017-09-28: 1000 mL

## 2017-09-28 MED ORDER — FENTANYL CITRATE (PF) 250 MCG/5ML IJ SOLN
INTRAMUSCULAR | Status: AC
Start: 2017-09-28 — End: 2017-09-28
  Filled 2017-09-28: qty 5

## 2017-09-28 MED ORDER — SUGAMMADEX SODIUM 200 MG/2ML IV SOLN
INTRAVENOUS | Status: DC | PRN
  Administered 2017-09-28: 120 mg via INTRAVENOUS

## 2017-09-28 MED ORDER — ONDANSETRON HCL 4 MG/2ML IJ SOLN
INTRAMUSCULAR | Status: AC
  Filled 2017-09-28: qty 4

## 2017-09-28 MED ORDER — DEXAMETHASONE SODIUM PHOSPHATE 10 MG/ML IJ SOLN: INTRAMUSCULAR | Status: DC | PRN

## 2017-09-28 MED ORDER — ENOXAPARIN SODIUM 40 MG/0.4ML ~~LOC~~ SOLN
40.0000 mg | SUBCUTANEOUS | Status: DC
  Administered 2017-09-29 – 2017-09-30 (×2): 40 mg via SUBCUTANEOUS
  Filled 2017-09-28 (×2): qty 0.4

## 2017-09-28 MED ORDER — METOCLOPRAMIDE HCL 5 MG PO TABS: 5.0000 mg | ORAL_TABLET | Freq: Three times a day (TID) | ORAL | Status: DC | PRN

## 2017-09-28 MED ORDER — ROCURONIUM BROMIDE 10 MG/ML (PF) SYRINGE
PREFILLED_SYRINGE | INTRAVENOUS | Status: AC
  Filled 2017-09-28: qty 5

## 2017-09-28 MED ORDER — ONDANSETRON HCL 4 MG/2ML IJ SOLN
INTRAMUSCULAR | Status: DC | PRN
  Administered 2017-09-28: 4 mg via INTRAVENOUS

## 2017-09-28 MED ORDER — HYDROCODONE-ACETAMINOPHEN 7.5-325 MG PO TABS
1.0000 | ORAL_TABLET | ORAL | Status: DC | PRN
  Administered 2017-09-29 – 2017-09-30 (×4): 1 via ORAL
  Filled 2017-09-28 (×4): qty 1

## 2017-09-28 MED ORDER — LIDOCAINE HCL (CARDIAC) 20 MG/ML IV SOLN
INTRAVENOUS | Status: DC | PRN
  Administered 2017-09-28: 50 mg via INTRAVENOUS

## 2017-09-28 MED ORDER — PROPOFOL 10 MG/ML IV BOLUS
INTRAVENOUS | Status: AC
  Filled 2017-09-28: qty 20

## 2017-09-28 MED ORDER — ONDANSETRON HCL 4 MG PO TABS: 4.0000 mg | ORAL_TABLET | Freq: Four times a day (QID) | ORAL | Status: DC | PRN

## 2017-09-28 MED ORDER — ACETAMINOPHEN 325 MG PO TABS: 325.0000 mg | ORAL_TABLET | Freq: Four times a day (QID) | ORAL | Status: DC | PRN

## 2017-09-28 MED ORDER — VANCOMYCIN HCL 1000 MG IV SOLR
INTRAVENOUS | Status: DC | PRN
  Administered 2017-09-28: 1000 mg via INTRAVENOUS

## 2017-09-28 MED ORDER — DEXAMETHASONE SODIUM PHOSPHATE 10 MG/ML IJ SOLN
INTRAMUSCULAR | Status: AC
  Filled 2017-09-28: qty 1

## 2017-09-28 MED ORDER — SUGAMMADEX SODIUM 200 MG/2ML IV SOLN
INTRAVENOUS | Status: AC
  Filled 2017-09-28: qty 2

## 2017-09-28 MED ORDER — METOCLOPRAMIDE HCL 5 MG/ML IJ SOLN: 5.0000 mg | Freq: Three times a day (TID) | INTRAMUSCULAR | Status: DC | PRN

## 2017-09-28 MED ORDER — ROCURONIUM BROMIDE 100 MG/10ML IV SOLN
INTRAVENOUS | Status: DC | PRN
  Administered 2017-09-28: 50 mg via INTRAVENOUS

## 2017-09-28 MED ORDER — PHENYLEPHRINE HCL 10 MG/ML IJ SOLN
INTRAMUSCULAR | Status: DC | PRN
  Administered 2017-09-28: 50 ug/min via INTRAVENOUS

## 2017-09-28 SURGICAL SUPPLY — 71 items
BANDAGE ACE 4X5 VEL STRL LF (GAUZE/BANDAGES/DRESSINGS) ×2 IMPLANT
BANDAGE ACE 6X5 VEL STRL LF (GAUZE/BANDAGES/DRESSINGS) ×2 IMPLANT
BIT DRILL 4.3 (BIT) ×1 IMPLANT
BIT DRILL LONG 3.3 (BIT) ×2 IMPLANT
BIT DRILL NCB FEM QC 4.3X245 (BIT) ×1 IMPLANT
BIT DRILL QC 3.3X195 (BIT) ×2 IMPLANT
BRUSH SCRUB SURG 4.25 DISP (MISCELLANEOUS) ×4 IMPLANT
CAP LOCK NCB (Cap) ×10 IMPLANT
COVER SURGICAL LIGHT HANDLE (MISCELLANEOUS) ×2 IMPLANT
DRAPE C-ARM 42X72 X-RAY (DRAPES) ×2 IMPLANT
DRAPE C-ARMOR (DRAPES) ×2 IMPLANT
DRAPE IMP U-DRAPE 54X76 (DRAPES) ×2 IMPLANT
DRAPE ORTHO SPLIT 77X108 STRL (DRAPES) ×2
DRAPE SURG ORHT 6 SPLT 77X108 (DRAPES) ×2 IMPLANT
DRAPE U-SHAPE 47X51 STRL (DRAPES) ×2 IMPLANT
DRILL 4.3MM (BIT) ×2
DRILL BIT 4.3 (BIT) ×1
DRSG ADAPTIC 3X8 NADH LF (GAUZE/BANDAGES/DRESSINGS) IMPLANT
DRSG MEPILEX BORDER 4X8 (GAUZE/BANDAGES/DRESSINGS) ×4 IMPLANT
DRSG PAD ABDOMINAL 8X10 ST (GAUZE/BANDAGES/DRESSINGS) IMPLANT
ELECT REM PT RETURN 9FT ADLT (ELECTROSURGICAL) ×2
ELECTRODE REM PT RTRN 9FT ADLT (ELECTROSURGICAL) ×1 IMPLANT
EVACUATOR 1/8 PVC DRAIN (DRAIN) IMPLANT
GAUZE SPONGE 4X4 12PLY STRL (GAUZE/BANDAGES/DRESSINGS) IMPLANT
GLOVE BIO SURGEON STRL SZ7.5 (GLOVE) ×2 IMPLANT
GLOVE BIO SURGEON STRL SZ8 (GLOVE) ×2 IMPLANT
GLOVE BIOGEL PI IND STRL 7.5 (GLOVE) ×1 IMPLANT
GLOVE BIOGEL PI IND STRL 8 (GLOVE) ×1 IMPLANT
GLOVE BIOGEL PI INDICATOR 7.5 (GLOVE) ×1
GLOVE BIOGEL PI INDICATOR 8 (GLOVE) ×1
GOWN STRL REUS W/ TWL LRG LVL3 (GOWN DISPOSABLE) ×2 IMPLANT
GOWN STRL REUS W/ TWL XL LVL3 (GOWN DISPOSABLE) ×1 IMPLANT
GOWN STRL REUS W/TWL LRG LVL3 (GOWN DISPOSABLE) ×2
GOWN STRL REUS W/TWL XL LVL3 (GOWN DISPOSABLE) ×1
K-WIRE 2.0 (WIRE) ×1
K-WIRE FXSTD 280X2XNS SS (WIRE) ×1
KIT BASIN OR (CUSTOM PROCEDURE TRAY) ×2 IMPLANT
KIT ROOM TURNOVER OR (KITS) ×2 IMPLANT
KWIRE FXSTD 280X2XNS SS (WIRE) ×1 IMPLANT
MANIFOLD NEPTUNE II (INSTRUMENTS) IMPLANT
NS IRRIG 1000ML POUR BTL (IV SOLUTION) ×2 IMPLANT
PACK TOTAL JOINT (CUSTOM PROCEDURE TRAY) ×2 IMPLANT
PACK UNIVERSAL I (CUSTOM PROCEDURE TRAY) IMPLANT
PAD ARMBOARD 7.5X6 YLW CONV (MISCELLANEOUS) ×4 IMPLANT
PAD CAST 4YDX4 CTTN HI CHSV (CAST SUPPLIES) ×1 IMPLANT
PADDING CAST COTTON 4X4 STRL (CAST SUPPLIES) ×1
PADDING CAST COTTON 6X4 STRL (CAST SUPPLIES) ×2 IMPLANT
PLATE RT DISTAL FEMUR NCB 18H (Plate) ×2 IMPLANT
SCREW 5.0 60MM (Screw) ×2 IMPLANT
SCREW 5.0 70MM (Screw) ×2 IMPLANT
SCREW CANN NCB PA 6.2X4.4/5 (Screw) ×2 IMPLANT
SCREW CORT NCB SELFTAP 5.0X42 (Screw) ×2 IMPLANT
SCREW NCB 3.5X75X5X6.2XST (Screw) ×2 IMPLANT
SCREW NCB 4.0MX34M (Screw) ×6 IMPLANT
SCREW NCB 4.0MX38M (Screw) ×2 IMPLANT
SCREW NCB 4.0X40MM (Screw) ×2 IMPLANT
SCREW NCB 5.0X26MM (Screw) ×2 IMPLANT
SCREW NCB 5.0X75MM (Screw) ×2 IMPLANT
SPONGE LAP 18X18 X RAY DECT (DISPOSABLE) IMPLANT
STAPLER VISISTAT 35W (STAPLE) ×2 IMPLANT
SUT ETHILON 2 0 FS 18 (SUTURE) ×6 IMPLANT
SUT VIC AB 0 CT1 27 (SUTURE) ×1
SUT VIC AB 0 CT1 27XBRD ANBCTR (SUTURE) ×1 IMPLANT
SUT VIC AB 1 CT1 27 (SUTURE)
SUT VIC AB 1 CT1 27XBRD ANTBC (SUTURE) IMPLANT
SUT VIC AB 2-0 CT1 27 (SUTURE) ×2
SUT VIC AB 2-0 CT1 TAPERPNT 27 (SUTURE) ×2 IMPLANT
TOWEL OR 17X24 6PK STRL BLUE (TOWEL DISPOSABLE) ×2 IMPLANT
TOWEL OR 17X26 10 PK STRL BLUE (TOWEL DISPOSABLE) ×4 IMPLANT
TRAY FOLEY W/METER SILVER 16FR (SET/KITS/TRAYS/PACK) ×2 IMPLANT
WATER STERILE IRR 1000ML POUR (IV SOLUTION) IMPLANT

## 2017-09-28 NOTE — Transfer of Care (Signed)
Immediate Anesthesia Transfer of Care Note  Patient: Maria Cameron  Procedure(s) Performed: OPEN REDUCTION INTERNAL FIXATION FEMORAL SHAFT FRACTURE (Right )  Patient Location: PACU  Anesthesia Type:General  Level of Consciousness: sedated  Airway & Oxygen Therapy: Patient Spontanous Breathing and Patient connected to nasal cannula oxygen  Post-op Assessment: Report given to RN, Post -op Vital signs reviewed and stable and Patient moving all extremities X 4  Post vital signs: Reviewed and stable  Last Vitals:  Vitals Value Taken Time  BP 182/77 09/28/2017 11:10 AM  Temp    Pulse 102 09/28/2017 11:12 AM  Resp 25 09/28/2017 11:12 AM  SpO2 93 % 09/28/2017 11:12 AM  Vitals shown include unvalidated device data.  Last Pain:  Vitals:   09/28/17 0355  TempSrc:   PainSc: Asleep      Patients Stated Pain Goal: 2 (09/27/17 1550)  Complications: No apparent anesthesia complications

## 2017-09-28 NOTE — Anesthesia Postprocedure Evaluation (Signed)
Anesthesia Post Note  Patient: Maria Cameron  Procedure(s) Performed: OPEN REDUCTION INTERNAL FIXATION FEMORAL SHAFT FRACTURE (Right )     Patient location during evaluation: PACU Anesthesia Type: General Level of consciousness: awake and alert Pain management: pain level controlled Vital Signs Assessment: post-procedure vital signs reviewed and stable Respiratory status: spontaneous breathing, nonlabored ventilation, respiratory function stable and patient connected to nasal cannula oxygen Cardiovascular status: blood pressure returned to baseline and stable Postop Assessment: no apparent nausea or vomiting Anesthetic complications: no    Last Vitals:  Vitals:   09/28/17 1302 09/28/17 1308  BP: (!) 169/56   Pulse:  91  Resp:  18  Temp:  (!) 36.1 C  SpO2:  99%    Last Pain:  Vitals:   09/28/17 1308  TempSrc:   PainSc: Asleep                 Nakesha Ebrahim

## 2017-09-28 NOTE — Progress Notes (Addendum)
  CONSULT NOTE   I discussed this case with Dr. Aundria Rud, who wishes me proceed given the complex nature of this comminuted periprosthetic fracture, the patient's underlying heart disease, and chronic anticoagulation.  I have reviewed his note and agree with his assessment. I have discussed with patient plan to span the entire bone with fixation and she strongly desires to ambulate and return to full function. Fracture occurred after ground level fall. Denies LOC or other injury.  SH Lives in facility  NCAT, blind No wheezing RLE Pain and swelling of right thigh  Edema/ swelling controlled  Sens: DPN, SPN, TN intact  Motor: EHL, FHL, and lessor toe ext and flex all intact grossly  Brisk cap refill, warm to touch  Right complex, comminuted periprosthetic femur fracture in 84 yo  I discussed with the patient the risks and benefits of surgery, including the possibility of infection, nerve injury, vessel injury, wound breakdown, arthritis, symptomatic hardware, DVT/ PE, loss of motion, malunion, nonunion, and need for further surgery among others. She acknowledged these risks and wished to proceed.  Myrene Galas, MD [facility] [phone] (p)

## 2017-09-28 NOTE — Progress Notes (Signed)
PROGRESS NOTE    Maria Cameron  RSW:[id number] DOB: [birth year] DOA: 09/25/2017 PCP: No primary care provider on file.   Brief Narrative: Patient is a 84 year old female with past medical history of hypertension, GERD, chronic anticoagulation on warfarin, coronary artery disease who presented from assisted living facility after she sustained a fall.  Patient reported that she was walking to the bathroom in the dark, she tripped and fell with immediate pain on the right hip.  Patient was found to have a right proximal comminuted femoral fracture. Orthopedics following.  Assessment & Plan:   Principal Problem:   Right femoral fracture (HCC) Active Problems:   Warfarin-induced coagulopathy (HCC)   CAD (coronary artery disease)   Hypothyroid   GERD (gastroesophageal reflux disease)   Depression   Displaced comminuted fracture of shaft of right femur, initial encounter for closed fracture (HCC)   Right femur fracture:X ray showing right  acute, comminuted fracture involving the proximal femoral diaphysis with dorsal angulation of the distal fracture fragment and internal rotation. She underwent open reduction and internal fixation on 09/28/17 PT/OT  consulted.  Elevated INR: Patient was on warfarin at home.   Resolution of Coumadin on this patient is questionable.  She has high risks of falls and that poses significant morbidity especially when she is on warfarin. I discussed with her daughter and the decision about stopping of warfarin will rest upon her PCP. Currently she is also on Lovenox for DVT ppx. Warfarin also restarted.She doesnot need to stay in the hospital for continuous bridging. When she gets bed for SNF,she can be discharged on  her home warfarin dose.No need to wait for INR optimization.  History of A. fib: On warfarin at home.    Hypothyroidism: Continue levothyroxine  Hypertension: Currently blood pressure stable.  Continue home regimen.  GERD: Continue  PPI  Depression: Continue Lexapro  CAD:   Continue her home meds   DVT prophylaxis: Lovenox,warfarin Code Status: Full Family Communication: None on the bed side today Disposition Plan: Likely SNF   Consultants: Orthopedics  Procedures: None  Antimicrobials: None  Subjective:  Patient seen and examined at the bed side. Just came from surgery and was sleepy. No complains of pain.  objective: Vitals:   09/28/17 1300 09/28/17 1302 09/28/17 1308 09/28/17 1335  BP:  (!) 169/56  (!) 154/67  Pulse: 90  91 92  Resp: 19  18 16   Temp:   (!) 97 F (36.1 C) 97.6 F (36.4 C)  TempSrc:    Oral  SpO2: 97%  99% 96%  Weight:      Height:        Intake/Output Summary (Last 24 hours) at 09/28/2017 1432 Last data filed at 09/28/2017 1310 Gross per 24 hour  Intake 2631.25 ml  Output 2100 ml  Net 531.25 ml   Filed Weights   09/26/17 1039  Weight: 57.8 kg (127 lb 6.8 oz)    Examination:  General exam: Appears calm and comfortable ,Not in distress,average built,elderly female HEENT:PERRL,Oral mucosa moist, Ear/Nose normal on gross exam Respiratory system: Bilateral equal air entry, normal vesicular breath sounds, no wheezes or crackles  Cardiovascular system: S1 & S2 heard, RRR. No JVD, murmurs, rubs, gallops or clicks. Gastrointestinal system: Abdomen is nondistended, soft and nontender. No organomegaly or masses felt. Normal bowel sounds heard. Central nervous system: Alert and oriented. No focal neurological deficits. Extremities: No edema, no clubbing ,no cyanosis, distal peripheral pulses palpable, tenderness on the right hip, Right lower extremity wrapped,ice packings Skin: No  rashes, lesions or ulcers,no icterus ,no pallor   Data Reviewed: I have personally reviewed following labs and imaging studies  CBC: Recent Labs  Lab 09/25/17 2140 09/26/17 0624 09/28/17 1356  WBC 7.9 7.8 8.2  NEUTROABS 4.4  --   --   HGB 12.6 11.0* 10.2*  HCT 38.1 33.0* 31.1*  MCV 99.2 98.8  100.0  PLT 242 193 178   Basic Metabolic Panel: Recent Labs  Lab 09/25/17 2140 09/26/17 0624  NA 138 139  K 3.8 3.6  CL 104 107  CO2 25 23  GLUCOSE 120* 151*  BUN 17 15  CREATININE 0.61 0.59  CALCIUM 9.0 8.4*   GFR: Estimated Creatinine Clearance: 41.2 mL/min (by C-G formula based on SCr of 0.59 mg/dL). Liver Function Tests: Recent Labs  Lab 09/26/17 0624  AST 27  ALT 26  ALKPHOS 66  BILITOT 0.6  PROT 5.5*  ALBUMIN 3.2*   No results for input(s): LIPASE, AMYLASE in the last 168 hours. No results for input(s): AMMONIA in the last 168 hours. Coagulation Profile: Recent Labs  Lab 09/25/17 2313 09/26/17 0850 09/27/17 0635 09/27/17 1215 09/28/17 0601  INR 1.91 2.04 1.26 1.15 1.16   Cardiac Enzymes: Recent Labs  Lab 09/26/17 0624  TROPONINI <0.03   BNP (last 3 results) No results for input(s): PROBNP in the last 8760 hours. HbA1C: No results for input(s): HGBA1C in the last 72 hours. CBG: No results for input(s): GLUCAP in the last 168 hours. Lipid Profile: No results for input(s): CHOL, HDL, LDLCALC, TRIG, CHOLHDL, LDLDIRECT in the last 72 hours. Thyroid Function Tests: No results for input(s): TSH, T4TOTAL, FREET4, T3FREE, THYROIDAB in the last 72 hours. Anemia Panel: No results for input(s): VITAMINB12, FOLATE, FERRITIN, TIBC, IRON, RETICCTPCT in the last 72 hours. Sepsis Labs: No results for input(s): PROCALCITON, LATICACIDVEN in the last 168 hours.  Recent Results (from the past 240 hour(s))  Surgical pcr screen     Status: Abnormal   Collection Time: 09/26/17 12:13 PM  Result Value Ref Range Status   MRSA, PCR POSITIVE (A) NEGATIVE Final   Staphylococcus aureus POSITIVE (A) NEGATIVE Final    Comment: RESULT CALLED TO, READ BACK BY AND VERIFIED WITH: R ARCILLA,RN AT 1641 09/26/17 BY L BENFIELD (NOTE) The Xpert SA Assay (FDA approved for NASAL specimens in patients 2 years of age and older), is one component of a comprehensive surveillance  program. It is not intended to diagnose infection nor to guide or monitor treatment. Performed at [facility], [address], [place], [place] [address]          Radiology Studies: Dg C-arm 1-60 Min  Result Date: 09/28/2017 CLINICAL DATA:  ORIF of right femoral fracture EXAM: DG C-ARM 61-120 MIN; RIGHT FEMUR 2 VIEWS COMPARISON:  09/25/2017 FLUOROSCOPY TIME:  Fluoroscopy Time:  1 minutes 26 seconds Radiation Exposure Index (if provided by the fluoroscopic device): 6.97 mGy Number of Acquired Spot Images: 6 FINDINGS: Right hip prosthesis is again identified. Fixation sideplate is noted along the lateral aspect of the femur with multiple proximal and distal fixation screws. The fracture fragments are in near anatomic alignment. No gross soft tissue abnormality is noted. IMPRESSION: ORIF of right midshaft femoral fracture Electronically Signed   By: Alcide Clever M.D.   On: 09/28/2017 12:56   Dg C-arm 1-60 Min  Result Date: 09/28/2017 CLINICAL DATA:  ORIF of right femoral fracture EXAM: DG C-ARM 61-120 MIN; RIGHT FEMUR 2 VIEWS COMPARISON:  09/25/2017 FLUOROSCOPY TIME:  Fluoroscopy Time:  1 minutes 26 seconds Radiation Exposure Index (if provided by the fluoroscopic device): 6.97 mGy Number of Acquired Spot Images: 6 FINDINGS: Right hip prosthesis is again identified. Fixation sideplate is noted along the lateral aspect of the femur with multiple proximal and distal fixation screws. The fracture fragments are in near anatomic alignment. No gross soft tissue abnormality is noted. IMPRESSION: ORIF of right midshaft femoral fracture Electronically Signed   By: Alcide Clever M.D.   On: 09/28/2017 12:56   Dg Femur, Min 2 Views Right  Result Date: 09/28/2017 CLINICAL DATA:  ORIF of right femoral fracture EXAM: DG C-ARM 61-120 MIN; RIGHT FEMUR 2 VIEWS COMPARISON:  09/25/2017 FLUOROSCOPY TIME:  Fluoroscopy Time:  1 minutes 26 seconds Radiation Exposure Index (if provided by the fluoroscopic device):  6.97 mGy Number of Acquired Spot Images: 6 FINDINGS: Right hip prosthesis is again identified. Fixation sideplate is noted along the lateral aspect of the femur with multiple proximal and distal fixation screws. The fracture fragments are in near anatomic alignment. No gross soft tissue abnormality is noted. IMPRESSION: ORIF of right midshaft femoral fracture Electronically Signed   By: Alcide Clever M.D.   On: 09/28/2017 12:56        Scheduled Meds: . acetaminophen  1,000 mg Oral TID  . amLODipine  5 mg Oral Daily  . ARIPiprazole  2.5 mg Oral Daily  . atorvastatin  20 mg Oral QHS  . carbamazepine  100 mg Oral BID  . Chlorhexidine Gluconate Cloth  6 each Topical Q0600  . dicyclomine  10 mg Oral TID AC & HS  . docusate sodium  100 mg Oral BID  . [START ON 09/29/2017] enoxaparin (LOVENOX) injection  40 mg Subcutaneous Q24H  . escitalopram  10 mg Oral Daily  . latanoprost  1 drop Both Eyes QHS  . levothyroxine  25 mcg Oral QAC breakfast  . Melatonin  3 mg Oral QHS  . mirtazapine  7.5 mg Oral QHS  . mupirocin ointment  1 application Nasal BID  . pantoprazole  40 mg Oral Daily  . polyethylene glycol  17 g Oral Daily  . warfarin  3 mg Oral ONCE-1800  . Warfarin - Pharmacist Dosing Inpatient   Does not apply q1800   Continuous Infusions: . sodium chloride 75 mL/hr at 09/28/17 0539  . vancomycin       LOS: 3 days    Time spent: 25 mins.More than 50% of that time was spent in counseling and/or coordination of care.   Please Note: This patient record was dictated using Animal nutritionist. Chart creation errors have been sought, but may not always have been located. Such creation errors do not reflect on the Standard of Medical Care.    Burnadette Pop, MD [facility] Pager [phone]  If 7PM-7AM, please contact night-coverage [email or web address] Password [facility] 09/28/2017, 2:32 PM

## 2017-09-28 NOTE — Brief Op Note (Signed)
09/28/2017  11:12 AM  PATIENT:  Juanell Saffo  84 y.o. female  PRE-OPERATIVE DIAGNOSIS:  Right periprosthetic femur fracture  POST-OPERATIVE DIAGNOSIS:  Right periprosthetic femur fracture  PROCEDURE:  Procedure(s): OPEN REDUCTION INTERNAL FIXATION PERIPROSTHETIC FEMORAL SHAFT FRACTURE (Right)  SURGEON:  Surgeon(s) and Role:    Myrene Galas, MD - Primary  PHYSICIAN ASSISTANT: PA Student  ANESTHESIA:   general  EBL:  50 mL   BLOOD ADMINISTERED:none  DRAINS: none   LOCAL MEDICATIONS USED:  NONE  SPECIMEN:  No Specimen  DISPOSITION OF SPECIMEN:  N/A  COUNTS:  YES  TOURNIQUET:  * No tourniquets in log *  DICTATION: .Other Dictation: Dictation Number [id number]  PLAN OF CARE: Admit to inpatient   PATIENT DISPOSITION:  PACU - hemodynamically stable.   Delay start of Pharmacological VTE agent (>24hrs) due to surgical blood loss or risk of bleeding: no

## 2017-09-28 NOTE — Clinical Social Work Note (Signed)
Clinical Social Work Assessment  Patient Details  Name: Maria Cameron MRN: [id number] Date of Birth: 28-Jun-1924  Date of referral:  09/28/17               Reason for consult:  Facility Placement                Permission sought to share information with:  Facility Art therapist granted to share information::  Yes, Release of Information Signed  Name::     Emergency planning/management officer::  SNF  Relationship::     Contact Information:     Housing/Transportation Living arrangements for the past 2 months:  [place] of Information:  Adult Children Patient Interpreter Needed:  None Criminal Activity/Legal Involvement Pertinent to Current Situation/Hospitalization:  No - Comment as needed Significant Relationships:  Adult Children, Other Family Members Lives with:  Facility Resident Do you feel safe going back to the place where you live?  No Need for family participation in patient care:  Yes (Comment)  Care giving concerns:  Pt with new impairment and will need short term rehab at discharge.  Social Worker assessment / plan:  CSW met with patient/daughter at bedside to discuss SNF placement as patient is a resident at [facility] in [place]. Daughter, Leonia Reeves, indicated that patient will need SNF and cannot return to ALF given the rehabiliation needs. Per daugther, patient has been to SNF in the past. Family desires for patient to go to [facility] as patient has been there in the past. CSW explained her role, SNF placement/process and transportation.  CSW obtained permission to send to [facility] and other SNF's in [place] as a back up plan.  Daughter resides in [place] and wants to ensure that patient needs are being met. CSW validated concerns and encouraged her to speak with clinical staff if any needs arise. Daughter agreed to same.  Employment status:  Retired Forensic scientist:  [employer] PT Recommendations:  [place] / Referral to community resources:  [place]  Patient/Family's Response to care:  Psychologist, prison and probation services of CSW meeting to discuss disposition plan. Daughter agreeable to SNF placement at discharge.  Patient/Family's Understanding of and Emotional Response to Diagnosis, Current Treatment, and Prognosis:  Patient/family has good understanding of patient's diagnosis and need for rehabilitation. Once patient has complete rehab, she will return back to [facility] [place]. Daughter is concerned about patients injury as patient had another procedure recently. Daughter resides out of the area and is not immediateley available when needed. CSW validated her feelings. CSW will assist with disposition.  Emotional Assessment Appearance:  Appears stated age Attitude/Demeanor/Rapport:  (Sedated) Affect (typically observed):  Other, Unable to Assess(Sedated) Orientation:  Oriented to Self Alcohol / Substance use:  Not Applicable Psych involvement (Current and /or in the community):  No (Comment)  Discharge Needs  Concerns to be addressed:  Discharge Planning Concerns Readmission within the last 30 days:  No Current discharge risk:  Physical Impairment, Dependent with Mobility Barriers to Discharge:  No Barriers Identified   Normajean Baxter, LCSW 09/28/2017, 3:03 PM

## 2017-09-28 NOTE — Discharge Instructions (Addendum)
1)Activity level as per orthopedic surgeon's instructions 2)Take baby aspirin daily with food for stroke prophylaxis in the setting of atrial fibrillation 3)Take Lovenox subcu for DVT prophylaxis  in the post operative patient (For 30 days, then PCP to Reevaluate) 4)Repeat CBC and BMP on Monday on 10/04/17  Femoral Shaft Fracture A femoral shaft fracture is a break (fracture) in the shaft of the thigh bone (femur). The femur is the long bone that connects the hip joint to the knee joint. Most femoral shaft fractures are closed fractures. A closed fracture is a break in a bone that happens without any cuts (lacerations) through the skin that is near the fracture site. Some femoral shaft fractures are open fractures. An open fracture is a break in a bone that happens along with lacerations through the skin that is near the fracture site. What are the causes? A healthy femur may break from a forceful impact, such as from:  A fall, especially from a great height.  A high-impact sports injury.  A car or motorcycle accident.  A weakened femur may break from minimal impact or force due to:  Certain medical conditions.  Age.  What increases the risk? This condition is more likely to develop in:  Older people.  People who have certain medical conditions that cause bones to become weak or thin, such as: ? Osteoporosis. ? Cancer. ? Osteogenesis imperfecta. This is a condition that involves bone weakness that is due to abnormal bone development.  People who take certain medicines (bisphosphonates) that are used to treat osteoporosis.  People who participate in high-risk sports or impact sports.  What are the signs or symptoms? Symptoms of this condition include:  Severe pain.  Inability to walk.  Bruising.  Swelling or visible deformity of the leg.  Substantial bleeding, if it is an open fracture.  How is this diagnosed? This condition is diagnosed based on your symptoms and a  physical exam. You may also have other tests, including:  X-rays of the femur. Since the force required to break a healthy femur can break other bones, X-rays are often taken of the hip, knee, and pelvis as well.  Evaluation of the blood vessels with specialized X-rays (arteriogram).  Evaluation of the nerves in the area of the break.  CT scan or MRI.  How is this treated? A femoral shaft fracture usually requires surgery. You may have one or more of the following surgical treatments:  External fixation. This involves using pins and screws to hold the bones in place if there is extensive soft tissue injury. After some time, you may need an additional surgical treatment, such as intramedullary nailing.  Intramedullary nailing. This involves inserting a rod (intramedullary nail) through an incision. The intramedullary nail goes down the center of the shaft of the femur. It may be inserted in the knee joint or from the top of the femur near the hip. Generally, screws are placed through the rod at both ends to prevent shortening or rotation of the femur as it heals.  Plates to stabilize the fracture. These may be used, especially when the fracture is at either end of the bone, near the hip or the knee.  In rare cases for which surgery is not an option, a cast or a splint may be used to hold (immobilize) the bone while it heals. How is this prevented? If factors such as certain medical conditions or age increase your risk for another femoral shaft fracture, you may be able  to prevent one if you follow these instructions:  Use aids for walking, such as a walker or cane, as directed by your health care provider.  Follow instructions from your health care provider about how to strengthen your bones if you have osteoporosis.  This information is not intended to replace advice given to you by your health care provider. Make sure you discuss any questions you have with your health care  provider. Document Released: 04/01/2005 Document Revised: 11/28/2015 Document Reviewed: 02/05/2014 Elsevier Interactive Patient Education  2018 [employer].  1)Activity level as per orthopedic surgeon's instructions 2)Take baby aspirin daily with food for stroke prophylaxis in the setting of atrial fibrillation 3)Take Lovenox subcu for DVT prophylaxis  in the post operative patient (For 30 days, then PCP to Reevaluate) 4)Repeat CBC and BMP on Monday on 10/04/17

## 2017-09-28 NOTE — Progress Notes (Signed)
ANTICOAGULATION CONSULT NOTE - Initial Consult  Pharmacy Consult for Coumadin Indication: h/o afib and VTE prophx.  Allergies  Allergen Reactions  . Codeine Other (See Comments)    Pt reports allergy but not documented    Patient Measurements: Height: 5\' 5"  (165.1 cm) Weight: 127 lb 6.8 oz (57.8 kg) IBW/kg (Calculated) : 57  Vital Signs: Temp: 97.6 F (36.4 C) (03/26 1335) Temp Source: Oral (03/26 1335) BP: 154/67 (03/26 1335) Pulse Rate: 92 (03/26 1335)  Labs: Recent Labs    09/25/17 2140  09/26/17 09/28/17  09/27/17 0635 09/27/17 1215 09/28/17 0601  HGB 12.6  --  11.0*  --   --   --   --   HCT 38.1  --  33.0*  --   --   --   --   PLT 242  --  193  --   --   --   --   LABPROT  --    < >  --    < > 15.7* 14.6 14.7  INR  --    < >  --    < > 1.26 1.15 1.16  CREATININE 0.61  --  0.59  --   --   --   --   TROPONINI  --   --  <0.03  --   --   --   --    < > = values in this interval not displayed.    Estimated Creatinine Clearance: 41.2 mL/min (by C-G formula based on SCr of 0.59 mg/dL).   Medical History: History reviewed. No pertinent past medical history.  Assessment: CC/HPI: fall with RLE fracture  PMH: HTN, GERD, CAD, chronic anticoag, hypothyroid, depression  Significant events: 3/26 ORIF femoral shaft fx.  Anticoag: Coumadin PTA for h/o afib, last dose 3/23 with INR 1.91 >> held for surgical repair of femoral fx. INR 1.16 for surgery 3/26 (pt on carbamazepine: enzyme inducer) - 3/24: Vit K 5mg  IV  - PTA dose: 1.5mg  TTh, and 2mg  all other days.  Goal of Therapy:  INR 2-3 Monitor platelets by anticoagulation protocol: Yes   Plan:  Coumadin 3mg  po x 1 tonight Lovenox 40mg /24h until INR>2 Daily INR    Johnnie Goynes S. , PharmD, BCPS Clinical Staff Pharmacist Pager [phone]  [place] 09/28/2017,2:03 PM

## 2017-09-28 NOTE — Op Note (Signed)
NAME:  Maria Cameron, Maria Cameron NO.:  [id number]  MEDICAL RECORD NO.:  [id number]  LOCATION:  [id number]                        FACILITY:  [facility]  PHYSICIAN:  Doralee Albino. Carola Frost, M.D. DATE OF BIRTH:  June 20, 1924  DATE OF PROCEDURE:  09/28/2017 DATE OF DISCHARGE:                              OPERATIVE REPORT   PREOPERATIVE DIAGNOSIS:  Right periprosthetic comminuted femoral shaft fracture.  POSTOPERATIVE DIAGNOSIS:  Right periprosthetic comminuted femoral shaft fracture.  PROCEDURE:  Open reduction and internal fixation of right femoral shaft fracture with bridge plating using Biomet NCB plate.  SURGEON:  Doralee Albino. Carola Frost, M.D.  ASSISTANT:  PA student.  ANESTHESIA:  General.  COMPLICATIONS:  None.  ESTIMATED BLOOD LOSS:  50 mL.  SPECIMENS:  None.  TOURNIQUET:  None.  DISPOSITION:  To PACU.  CONDITION:  Stable.  BRIEF SUMMARY OF INDICATION FOR PROCEDURE:  The patient is a very pleasant 84 year old female who sustained a ground-level fall.  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  She lives in a facility with her roommate and is quite active using a cane or occasional walker and strongly desires to return to ambulatory function.  I did discuss with her the risks and benefits of surgery preoperatively including potential for heart attack, stroke, nonunion, malunion, symptomatic hardware, DVT, PE, loss of motion, need for further surgery, and multiple others and she did strongly wish to proceed.  BRIEF SUMMARY OF PROCEDURE:  The patient was given vancomycin preoperatively, taken to the operating room where general anesthesia was induced.  Her right lower extremity was prepped and draped in usual sterile fashion.  A Foley was placed prior to positioning.  C-arm was brought in after time-out and the appropriate-length plate which was an 18-hole was selected.  Distal incision was made just long enough to allow for insertion of the plate with the guide in  place.  I used a series of bumps to dial in an anatomic reduction on the lateral, however, there remained translation on the AP.  The plate was then advanced to the distal incision without a formal open incision in the area of the fracture site in order to protect the fracture milieu and optimize healing with this bridge technique.  Proximally, we did place a screw just distal to the stem and again checked its position on multiple views.  The screw was then tightened, and traction and additional maneuvers performed in order to continuously improve the reduction.  A screw was then placed in the distal fragment and tightened.  C-arm was once more used to check AP and lateral with excellent position and reduction noted.  A total of 5 screws were placed distally and then additional screw placed proximally as this one was tight and I was able to go back and forth.  This screw was angled anterior to the stem, but did end up posterior.  The AP reduction was outstanding with slight apex posterior on the lateral.  Additional screws were placed posterior to the stem proximally such that we had 5 bicortical screws with the most proximal screw being freehanded into a more anteverted, more anterior direction in order to secure good purchase in the bone.  These went through the mantle and again had outstanding purchase.  They were smaller diameter 4-mm screws to avoid excessive stress or weakening of the proximal fragment.  Final AP and lateral images showed excellent reduction.  It did not change the initial long lag screw which was slightly prominent medially because of the angle on the screw and I could not.  I was afraid this screw would either strip or break and as it was providing excellent fixation without causing undue problems on the far side, it was retained.  Following a thorough irrigation, standard layered closure was performed with 0 Vicryl, 2-0 Vicryl, and 3- 0 nylon.  Sterile gently  compressive dressing was applied.  PA student assisted me throughout.  There were no complications.  The patient was awakened from anesthesia and transported to PACU in stable condition.  PROGNOSIS:  The patient will be nonweightbearing for the next 6 weeks with graduated weightbearing with physical therapy thereafter.  She has no range-of-motion restrictions of her knee.  Will continue hip precautions because of her toe, hip implant, which is longstanding over the last 20 years.  Because of her increased age and cardiac conditions, she has increased risk of complications in the perioperative period, but there was a little blood loss, which should reduce the physiologic stress that might be associated with this, otherwise.     Doralee Albino. Carola Frost, M.D.     [facility]/MEDQ  D:  09/28/2017  T:  09/28/2017  Job:  [id number]

## 2017-09-28 NOTE — Anesthesia Procedure Notes (Signed)
Procedure Name: Intubation Date/Time: 09/28/2017 8:22 AM Performed by: Neldon Newport, CRNA Pre-anesthesia Checklist: Timeout performed, Patient being monitored, Suction available, Emergency Drugs available and Patient identified Patient Re-evaluated:Patient Re-evaluated prior to induction Oxygen Delivery Method: Circle system utilized Preoxygenation: Pre-oxygenation with 100% oxygen Induction Type: IV induction Ventilation: Oral airway inserted - appropriate to patient size and Mask ventilation without difficulty Laryngoscope Size: Mac and 3 Grade View: Grade I Tube type: Oral Tube size: 7.0 mm Number of attempts: 1 Placement Confirmation: breath sounds checked- equal and bilateral,  positive ETCO2 and ETT inserted through vocal cords under direct vision Secured at: 21 cm Tube secured with: Tape Dental Injury: Teeth and Oropharynx as per pre-operative assessment

## 2017-09-29 ENCOUNTER — Inpatient Hospital Stay (HOSPITAL_COMMUNITY): Payer: Medicare Other

## 2017-09-29 ENCOUNTER — Encounter (HOSPITAL_COMMUNITY): Payer: Self-pay | Admitting: Orthopedic Surgery

## 2017-09-29 DIAGNOSIS — S72011A Unspecified intracapsular fracture of right femur, initial encounter for closed fracture: Secondary | ICD-10-CM

## 2017-09-29 LAB — PROTIME-INR
INR: 1.16
Prothrombin Time: 14.7 seconds (ref 11.4–15.2)

## 2017-09-29 MED ORDER — WARFARIN SODIUM 3 MG PO TABS
3.0000 mg | ORAL_TABLET | Freq: Once | ORAL | Status: DC
  Filled 2017-09-29: qty 1

## 2017-09-29 MED ORDER — ASPIRIN 81 MG PO CHEW
81.0000 mg | CHEWABLE_TABLET | Freq: Every day | ORAL | Status: DC
  Administered 2017-09-30: 81 mg via ORAL
  Filled 2017-09-29: qty 1

## 2017-09-29 NOTE — Social Work (Addendum)
CSW contacted SNF again and [facility] can offer a SNF bed. CSW confirmed with daughter and she will f/u with SNF on admission process.  CSW will continue to follow for disposition.  Keene Breath, LCSW Clinical Social Worker [phone]

## 2017-09-29 NOTE — Evaluation (Signed)
Physical Therapy Evaluation Patient Details Name: Maria Cameron MRN: [id number] DOB: 1924-06-26 Today's Date: 09/29/2017   History of Present Illness  Maria Cameron is a 84 y.o. female with medical history significant of HTN, GERD, Chronic anticoagulation and CAD who was in ALF and sustained a fall when she walked to the bathroom in the dark. She tripped and fell. Sustained right proximal comminuted Femural fracture. Now s/p surgery for fixation, NWB  Clinical Impression   Patient is s/p above surgery resulting in functional limitations due to the deficits listed below (see PT Problem List). Pt resides in ALF, reports mod independence with ADLs and functional mobility at baseline (Pt's daughter indicated perhaps she doesn't ask for help when she needs help). Pt presenting with pain in RLE, impacting her activity tolerance, sitting and standing balance. Pt requiring MaxA+2 for bed mobility and for stand pivot transfers this session Patient will benefit from skilled PT to increase their independence and safety with mobility to allow discharge to the venue listed below.       Follow Up Recommendations SNF    Equipment Recommendations  Other (comment)(TBD at SNF)    Recommendations for Other Services       Precautions / Restrictions Precautions Precautions: Fall Precaution Comments: Pt is blind Restrictions Weight Bearing Restrictions: Yes RLE Weight Bearing: Non weight bearing      Mobility  Bed Mobility Overal bed mobility: Needs Assistance Bed Mobility: Supine to Sit     Supine to sit: +2 for physical assistance;+2 for safety/equipment;Max assist     General bed mobility comments: helicopter method utilized with pt self assisting using hand rails; assist provided to support RLE while advancing LEs over EOB and to bring trunk upright into sitting  Transfers Overall transfer level: Needs assistance Equipment used: 2 person hand held assist(support at gait belt and L knee  blocked) Transfers: Squat Pivot Transfers     Squat pivot transfers: Max assist;+2 physical assistance;+2 safety/equipment     General transfer comment: +2 assist to rise and pivot on LLE to recliner (transferring to the L)   Ambulation/Gait                Stairs            Wheelchair Mobility    Modified Rankin (Stroke Patients Only)       Balance Overall balance assessment: Needs assistance   Sitting balance-Leahy Scale: Poor Sitting balance - Comments: pt intermittently maintains static sitting, though often leaning heavily to the L to offset pressure in R hip, requires assist and cues to correct      Standing balance-Leahy Scale: Zero                               Pertinent Vitals/Pain Pain Assessment: Faces Faces Pain Scale: Hurts whole lot Pain Location: R LE with movement  Pain Descriptors / Indicators: Crying;Guarding;Grimacing Pain Intervention(s): Monitored during session;Patient requesting pain meds-RN notified    Home Living Family/patient expects to be discharged to:: Assisted living               Home Equipment: Bedside commode;Shower seat      Prior Function Level of Independence: Needs assistance   Gait / Transfers Assistance Needed: with assistive device     Comments: Daughter expressed concerns that Ms. Hem doesn't  ask for help when she needs help     Hand Dominance   Dominant Hand: Right  Extremity/Trunk Assessment   Upper Extremity Assessment Upper Extremity Assessment: Defer to OT evaluation    Lower Extremity Assessment Lower Extremity Assessment: Generalized weakness;RLE deficits/detail RLE Deficits / Details: Motion is limited in all planes for hip motion by pain RLE: Unable to fully assess due to pain       Communication   Communication: No difficulties  Cognition Arousal/Alertness: Awake/alert Behavior During Therapy: WFL for tasks assessed/performed Overall Cognitive Status: Within  Functional Limits for tasks assessed(for simple mobility tasks)                                 General Comments: pt does require multimodal cues at times to follow instructions, though suspect partly due to pt's low vision (blind) vs cognition       General Comments      Exercises     Assessment/Plan    PT Assessment Patient needs continued PT services  PT Problem List Decreased strength;Decreased range of motion;Decreased activity tolerance;Decreased balance;Decreased mobility;Decreased coordination;Decreased cognition;Decreased knowledge of use of DME;Decreased safety awareness;Decreased knowledge of precautions;Pain       PT Treatment Interventions DME instruction;Gait training;Functional mobility training;Therapeutic activities;Therapeutic exercise;Balance training;Cognitive remediation;Patient/family education    PT Goals (Current goals can be found in the Care Plan section)  Acute Rehab PT Goals Patient Stated Goal: less pain PT Goal Formulation: With patient Time For Goal Achievement: 10/13/17 Potential to Achieve Goals: Good    Frequency Min 2X/week   Barriers to discharge        Co-evaluation PT/OT/SLP Co-Evaluation/Treatment: Yes Reason for Co-Treatment: Complexity of the patient's impairments (multi-system involvement);For patient/therapist safety;To address functional/ADL transfers PT goals addressed during session: Mobility/safety with mobility OT goals addressed during session: ADL's and self-care       AM-PAC PT "6 Clicks" Daily Activity  Outcome Measure Difficulty turning over in bed (including adjusting bedclothes, sheets and blankets)?: Unable Difficulty moving from lying on back to sitting on the side of the bed? : Unable Difficulty sitting down on and standing up from a chair with arms (e.g., wheelchair, bedside commode, etc,.)?: Unable Help needed moving to and from a bed to chair (including a wheelchair)?: Total Help needed walking  in hospital room?: Total Help needed climbing 3-5 steps with a railing? : Total 6 Click Score: 6    End of Session Equipment Utilized During Treatment: Gait belt Activity Tolerance: Patient limited by pain Patient left: in chair;with call bell/phone within reach;with chair alarm set Nurse Communication: Mobility status PT Visit Diagnosis: Muscle weakness (generalized) (M62.81);Difficulty in walking, not elsewhere classified (R26.2);Pain Pain - Right/Left: Right Pain - part of body: Hip    Time: 5093-2671 PT Time Calculation (min) (ACUTE ONLY): 20 min   Charges:   PT Evaluation $PT Eval Moderate Complexity: 1 Mod     PT G Codes:        Van Clines, PT  Acute Rehabilitation Services Pager [phone] Office [phone]   Levi Aland 09/29/2017, 1:37 PM

## 2017-09-29 NOTE — Social Work (Signed)
CSW is awaiting for PT evaluation.  Family interested in [facility].  CSW f/u for SNF placement. SNF will need to obtain Insurance Auth.  Keene Breath, LCSW Clinical Social Worker [phone]

## 2017-09-29 NOTE — Progress Notes (Signed)
[facility] (OTS)  1 Day Post-Op Procedure(s) (LRB): OPEN REDUCTION INTERNAL FIXATION FEMORAL SHAFT FRACTURE (Right)  Subjective: Patient reports pain as moderate.    Objective: Current Vitals Blood pressure (!) 129/58, pulse 76, temperature 98.2 F (36.8 C), temperature source Oral, resp. rate 10, height 5\' 5"  (1.651 m), weight 57.8 kg (127 lb 6.8 oz), SpO2 97 %. Vital signs in last 24 hours: Temp:  [98.1 F (36.7 C)-98.4 F (36.9 C)] 98.2 F (36.8 C) (03/27 1300) Pulse Rate:  [72-97] 76 (03/27 1300) Resp:  [10] 10 (03/27 1300) BP: (116-149)/(46-58) 129/58 (03/27 1300) SpO2:  [97 %-98 %] 97 % (03/27 1300)  Intake/Output from previous day: 03/26 0701 - 03/27 0700 In: 1810 [I.V.:1360; IV Piggyback:250] Out: 2700 [Urine:2650; Blood:50]  LABS Recent Labs    09/28/17 1356  HGB 10.2*   Recent Labs    09/28/17 1356  WBC 8.2  RBC 3.11*  HCT 31.1*  PLT 178   Recent Labs    09/28/17 1356  CREATININE 0.63   Recent Labs    09/28/17 1356 09/29/17 0512  INR 1.12 1.16     Physical Exam RLE  Dressing intact, clean, dry  Edema/ swelling controlled  Sens: DPN, SPN, TN intact  Motor: EHL, FHL, and lessor toe ext and flex all intact grossly  Brisk cap refill, warm to touch  Assessment/Plan: 1 Day Post-Op Procedure(s) (LRB): OPEN REDUCTION INTERNAL FIXATION FEMORAL SHAFT FRACTURE (Right) 1. PT/OT NWB RLE with unrestricted knee motion 2. DVT proph Lovenox 3. SW for placement  10/01/17, MD [facility], Myrene Galas [phone] (p)

## 2017-09-29 NOTE — Progress Notes (Signed)
Patient Demographics:    Maria Cameron, is a 84 y.o. female, DOB - 10-07-1923, YBW:[id number]  Admit date - 09/25/2017   Admitting Physician Rometta Emery, MD  Outpatient Primary MD for the patient is No primary care provider on file.  LOS - 4   Chief Complaint  Patient presents with  . Fall  . Leg Injury        Subjective:    Maria Cameron today has no fevers, no emesis, , daughter at bedside, no new complaints no chest pains no palpitations  Assessment  & Plan :    Principal Problem:   Right femoral fracture (HCC) Active Problems:   Warfarin-induced coagulopathy (HCC)   CAD (coronary artery disease)   Hypothyroid   GERD (gastroesophageal reflux disease)   Depression   Displaced comminuted fracture of shaft of right femur, initial encounter for closed fracture [facility])  Principal Problem:   Right femoral fracture (HCC) Active Problems:   Warfarin-induced coagulopathy (HCC)   CAD (coronary artery disease)   Hypothyroid   GERD (gastroesophageal reflux disease)   Depression   Displaced comminuted fracture of shaft of right femur, initial encounter for closed fracture [facility])   Brief Narrative: Patient is a 84 year old female with past medical history of hypertension, GERD, chronic anticoagulation on warfarin, coronary artery disease who presented from assisted living facility after she sustained a fall.  Patient reported that she was walking to the bathroom in the dark, she tripped and fell with immediate pain on the right hip.  Patient was found to have a right proximal comminuted femoral fracture. Orthopedics following.    Assessment & Plan:   1)Right femur fracture:X ray showing rightacute, comminuted fracture involving the proximal femoral diaphysis with dorsal angulation of the distal fracture fragment and internal rotation, She underwent open reduction and internal fixation on 09/28/17,  PT OT consult noted, awaiting skilled nursing facility placement   2)Elevated INR-   She has high risks of falls and that poses significant morbidity especially when she is on warfarin.  After discussion with patient and daughter they have decided to stop warfarin for now and use Lovenox subcu 40 mg daily for DVT prophylaxis in the post hip surgery patient , once DVT prophylaxis dose of Lovenox if discontinued patient will discuss with PCP about risk versus benefit of going on Coumadin for stroke prophylaxis due to history of atrial fibrillation   3)History of A. Fib/CAD: Patient was on Coumadin at home, she will be on Lovenox subcu for DVT prophylaxis due to hip surgery for now, use aspirin and Lipitor for CAD for now.  Patient and daughter will talk to PCP post discharge from rehab about Risk Versus benefit of Coumadin anticoagulation once she is off the Lovenox  4)Hypothyroidism: Continue levothyroxine  5)Hypertension: Stable, continue amlodipine 5 mg daily   6)Depression: Continue Lexapro and Remeron      DVT prophylaxis: Lovenox,  Code Status: Full Family Communication: daughter at [place]  Disposition Plan:  SNF   Consultants: Orthopedics  Procedures: None  Antimicrobials: None    Lab Results  Component Value Date   PLT 178 09/28/2017    Inpatient Medications  Scheduled Meds: . acetaminophen  1,000 mg Oral TID  . amLODipine  5 mg Oral Daily  .  ARIPiprazole  2.5 mg Oral Daily  . atorvastatin  20 mg Oral QHS  . carbamazepine  100 mg Oral BID  . Chlorhexidine Gluconate Cloth  6 each Topical Q0600  . dicyclomine  10 mg Oral TID AC & HS  . docusate sodium  100 mg Oral BID  . enoxaparin (LOVENOX) injection  40 mg Subcutaneous Q24H  . escitalopram  10 mg Oral Daily  . latanoprost  1 drop Both Eyes QHS  . levothyroxine  25 mcg Oral QAC breakfast  . Melatonin  3 mg Oral QHS  . mirtazapine  7.5 mg Oral QHS  . mupirocin ointment  1 application Nasal BID  .  pantoprazole  40 mg Oral Daily  . polyethylene glycol  17 g Oral Daily   Continuous Infusions: . sodium chloride 75 mL/hr at 09/28/17 2008   PRN Meds:.acetaminophen **OR** acetaminophen, diphenhydrAMINE, guaifenesin, HYDROcodone-acetaminophen, HYDROcodone-acetaminophen, loperamide, LORazepam, metoCLOPramide **OR** metoCLOPramide (REGLAN) injection, morphine injection, ondansetron **OR** ondansetron (ZOFRAN) IV, polyvinyl alcohol, simethicone    Anti-infectives (From admission, onward)   Start     Dose/Rate Route Frequency Ordered Stop   09/28/17 2000  vancomycin (VANCOCIN) IVPB 1000 mg/200 mL premix     1,000 mg 200 mL/hr over 60 Minutes Intravenous Every 12 hours 09/28/17 1332 09/28/17 2110   09/28/17 0600  vancomycin (VANCOCIN) IVPB 1000 mg/200 mL premix  Status:  Discontinued     1,000 mg 200 mL/hr over 60 Minutes Intravenous  Once 09/27/17 1447 09/28/17 1330        Objective:   Vitals:   09/29/17 0131 09/29/17 0705 09/29/17 1158 09/29/17 1300  BP: (!) 116/56 (!) 149/52 (!) 149/52 (!) 129/58  Pulse: 77 72  76  Resp:    10  Temp: 98.1 F (36.7 C) 98.4 F (36.9 C)  98.2 F (36.8 C)  TempSrc: Axillary Oral  Oral  SpO2: 98% 97%  97%  Weight:      Height:        Wt Readings from Last 3 Encounters:  09/26/17 57.8 kg (127 lb 6.8 oz)     Intake/Output Summary (Last 24 hours) at 09/29/2017 1745 Last data filed at 09/29/2017 1255 Gross per 24 hour  Intake 280 ml  Output 1000 ml  Net -720 ml     Physical Exam  Gen:- Awake Alert,   HEENT:- [place].AT, No sclera icterus Neck-Supple Neck,No JVD,.  Lungs-  CTAB  CV- S1, S2 normal Abd-  +ve B.Sounds, Abd Soft, No tenderness,    Extremity/Skin:- No  edema,   left leg dressing on Psych-affect is appropriate, oriented x3 Neuro-no new focal deficits, no tremors   Data Review:   Micro Results Recent Results (from the past 240 hour(s))  Surgical pcr screen     Status: Abnormal   Collection Time: 09/26/17 12:13 PM  Result  Value Ref Range Status   MRSA, PCR POSITIVE (A) NEGATIVE Final   Staphylococcus aureus POSITIVE (A) NEGATIVE Final    Comment: RESULT CALLED TO, READ BACK BY AND VERIFIED WITH: R ARCILLA,RN AT 1641 09/26/17 BY L BENFIELD (NOTE) The Xpert SA Assay (FDA approved for NASAL specimens in patients 33 years of age and older), is one component of a comprehensive surveillance program. It is not intended to diagnose infection nor to guide or monitor treatment. Performed at [facility], [address], [place], [place] [address]     Radiology Reports Dg Chest 1 View  Result Date: 09/25/2017 CLINICAL DATA:  Unwitnessed fall preop. EXAM: CHEST  1  VIEW COMPARISON:  None. FINDINGS: Low lung volumes without acute pneumonic consolidation or CHF. No effusion. Heart size is normal with aortic atherosclerosis. Left-sided pacemaker apparatus with right atrial and right ventricular leads are noted. IMPRESSION: No active disease.  Aortic atherosclerosis. Electronically Signed   By: Tollie Eth M.D.   On: 09/25/2017 22:59   Ct Head Wo Contrast  Result Date: 09/25/2017 CLINICAL DATA:  Unwitnessed fall in the bathroom this evening. EXAM: CT HEAD WITHOUT CONTRAST CT CERVICAL SPINE WITHOUT CONTRAST TECHNIQUE: Multidetector CT imaging of the head and cervical spine was performed following the standard protocol without intravenous contrast. Multiplanar CT image reconstructions of the cervical spine were also generated. COMPARISON:  None. FINDINGS: CT HEAD FINDINGS BRAIN: There is sulcal and ventricular prominence consistent with superficial and central atrophy. No intraparenchymal hemorrhage, mass effect nor midline shift. Periventricular and subcortical white matter hypodensities consistent with chronic small vessel ischemic disease are identified. No acute large vascular territory infarcts. No abnormal extra-axial fluid collections. Basal cisterns are not effaced and midline. VASCULAR: Moderate calcific  atherosclerosis of the carotid siphons. SKULL: No skull fracture. No significant scalp soft tissue swelling. SINUSES/ORBITS: The mastoid air-cells are clear. Right maxillary sinus atelectasis with small appearing sinus cavity completely filled with mucus. Right maxillary sinus wall thickening is also noted compatible with chronic sinusitis. Included ocular globes and orbital contents are non-suspicious. Right cataract extraction. OTHER: None. CT CERVICAL SPINE FINDINGS Alignment: Maintained cervical lordosis. Skull base and vertebrae: Osteopenia without acute cervical spine fracture. Intact skull base. Soft tissues and spinal canal: No prevertebral fluid or swelling. No visible canal hematoma. Disc levels: Marked disc space narrowing from C2 through C6 with small posterior marginal osteophytes. No jumped or perched facets. Uncovertebral joint osteoarthritis is seen bilaterally from C2 through C6. No significant central or neural foraminal encroachment. Upper chest: Apical pleuroparenchymal scarring bilaterally. Other: None IMPRESSION: 1. Atrophy with chronic white matter small vessel ischemia. No acute intracranial abnormality. 2. Right maxillary sinus atelectasis with complete opacification of the included maxillary sinus. Given wall thickening, findings are in keeping with chronic sinusitis. 3. Cervical spondylosis with marked disc space narrowing from C2 through C6. Uncovertebral joint osteoarthritis is also noted as well as facet arthropathy. No acute cervical spine fracture. Electronically Signed   By: Tollie Eth M.D.   On: 09/25/2017 22:23   Ct Cervical Spine Wo Contrast  Result Date: 09/25/2017 CLINICAL DATA:  Unwitnessed fall in the bathroom this evening. EXAM: CT HEAD WITHOUT CONTRAST CT CERVICAL SPINE WITHOUT CONTRAST TECHNIQUE: Multidetector CT imaging of the head and cervical spine was performed following the standard protocol without intravenous contrast. Multiplanar CT image reconstructions of the  cervical spine were also generated. COMPARISON:  None. FINDINGS: CT HEAD FINDINGS BRAIN: There is sulcal and ventricular prominence consistent with superficial and central atrophy. No intraparenchymal hemorrhage, mass effect nor midline shift. Periventricular and subcortical white matter hypodensities consistent with chronic small vessel ischemic disease are identified. No acute large vascular territory infarcts. No abnormal extra-axial fluid collections. Basal cisterns are not effaced and midline. VASCULAR: Moderate calcific atherosclerosis of the carotid siphons. SKULL: No skull fracture. No significant scalp soft tissue swelling. SINUSES/ORBITS: The mastoid air-cells are clear. Right maxillary sinus atelectasis with small appearing sinus cavity completely filled with mucus. Right maxillary sinus wall thickening is also noted compatible with chronic sinusitis. Included ocular globes and orbital contents are non-suspicious. Right cataract extraction. OTHER: None. CT CERVICAL SPINE FINDINGS Alignment: Maintained cervical lordosis. Skull base and vertebrae: Osteopenia without acute cervical  spine fracture. Intact skull base. Soft tissues and spinal canal: No prevertebral fluid or swelling. No visible canal hematoma. Disc levels: Marked disc space narrowing from C2 through C6 with small posterior marginal osteophytes. No jumped or perched facets. Uncovertebral joint osteoarthritis is seen bilaterally from C2 through C6. No significant central or neural foraminal encroachment. Upper chest: Apical pleuroparenchymal scarring bilaterally. Other: None IMPRESSION: 1. Atrophy with chronic white matter small vessel ischemia. No acute intracranial abnormality. 2. Right maxillary sinus atelectasis with complete opacification of the included maxillary sinus. Given wall thickening, findings are in keeping with chronic sinusitis. 3. Cervical spondylosis with marked disc space narrowing from C2 through C6. Uncovertebral joint  osteoarthritis is also noted as well as facet arthropathy. No acute cervical spine fracture. Electronically Signed   By: Tollie Eth M.D.   On: 09/25/2017 22:23   Dg C-arm 1-60 Min  Result Date: 09/28/2017 CLINICAL DATA:  ORIF of right femoral fracture EXAM: DG C-ARM 61-120 MIN; RIGHT FEMUR 2 VIEWS COMPARISON:  09/25/2017 FLUOROSCOPY TIME:  Fluoroscopy Time:  1 minutes 26 seconds Radiation Exposure Index (if provided by the fluoroscopic device): 6.97 mGy Number of Acquired Spot Images: 6 FINDINGS: Right hip prosthesis is again identified. Fixation sideplate is noted along the lateral aspect of the femur with multiple proximal and distal fixation screws. The fracture fragments are in near anatomic alignment. No gross soft tissue abnormality is noted. IMPRESSION: ORIF of right midshaft femoral fracture Electronically Signed   By: Alcide Clever M.D.   On: 09/28/2017 12:56   Dg C-arm 1-60 Min  Result Date: 09/28/2017 CLINICAL DATA:  ORIF of right femoral fracture EXAM: DG C-ARM 61-120 MIN; RIGHT FEMUR 2 VIEWS COMPARISON:  09/25/2017 FLUOROSCOPY TIME:  Fluoroscopy Time:  1 minutes 26 seconds Radiation Exposure Index (if provided by the fluoroscopic device): 6.97 mGy Number of Acquired Spot Images: 6 FINDINGS: Right hip prosthesis is again identified. Fixation sideplate is noted along the lateral aspect of the femur with multiple proximal and distal fixation screws. The fracture fragments are in near anatomic alignment. No gross soft tissue abnormality is noted. IMPRESSION: ORIF of right midshaft femoral fracture Electronically Signed   By: Alcide Clever M.D.   On: 09/28/2017 12:56   Dg Hip Unilat W Or Wo Pelvis 2-3 Views Right  Result Date: 09/25/2017 CLINICAL DATA:  Unwitnessed fall.  Right hip and leg pain. EXAM: DG HIP (WITH OR WITHOUT PELVIS) 2-3V RIGHT COMPARISON:  None. FINDINGS: No acute laterally displaced fracture of the femoral diaphysis with dorsal angulation is noted just distal to the cemented  right hip arthroplasty. The fracture undermines the tip of the femoral cement. No joint dislocation is seen. There is lower lumbar degenerative disc and facet arthropathy. Bony pelvis appears intact. The native left hip joint is maintained. IMPRESSION: Acute, closed, dorsally angulated and laterally displaced fracture of the femoral diaphysis. Electronically Signed   By: Tollie Eth M.D.   On: 09/25/2017 22:49   Dg Femur, Min 2 Views Right  Result Date: 09/28/2017 CLINICAL DATA:  ORIF of right femoral fracture EXAM: DG C-ARM 61-120 MIN; RIGHT FEMUR 2 VIEWS COMPARISON:  09/25/2017 FLUOROSCOPY TIME:  Fluoroscopy Time:  1 minutes 26 seconds Radiation Exposure Index (if provided by the fluoroscopic device): 6.97 mGy Number of Acquired Spot Images: 6 FINDINGS: Right hip prosthesis is again identified. Fixation sideplate is noted along the lateral aspect of the femur with multiple proximal and distal fixation screws. The fracture fragments are in near anatomic alignment. No gross soft tissue  abnormality is noted. IMPRESSION: ORIF of right midshaft femoral fracture Electronically Signed   By: Alcide Clever M.D.   On: 09/28/2017 12:56   Dg Femur Min 2 Views Right  Result Date: 09/25/2017 CLINICAL DATA:  Unwitnessed fall.  Leg pain. EXAM: RIGHT FEMUR 2 VIEWS COMPARISON:  None. FINDINGS: An acute, comminuted fracture involving the proximal femoral diaphysis undermining the cement-bone interface of a pre-existing right hip arthroplasty is identified. There is dorsal angulation of the distal fracture fragment with internal rotation of the knee seen on the frog-leg view. There is anterior and lateral displacement 1/4 shaft width of the distal fracture fragment. IMPRESSION: Acute, comminuted fracture involving the proximal femoral diaphysis with dorsal angulation of the distal fracture fragment and internal rotation of the. 1/4 shaft width anterior and lateral displacement is also noted of the distal fracture fragment.  Electronically Signed   By: Tollie Eth M.D.   On: 09/25/2017 22:55   Dg Femur [place] 2 Views Right  Result Date: 09/29/2017 CLINICAL DATA:  Femur fracture repair EXAM: RIGHT FEMUR PORTABLE 2 VIEW COMPARISON:  Intraoperative imaging 09/28/2017 FINDINGS: Plate and screw fixation across the mid shaft right femoral fracture. Continued anterior displacement of the fracture fragment on the lateral view. Hardware from prior right hip replacement noted. IMPRESSION: Internal fixation across the midshaft right femoral fracture. Continued anterior displacement of the distal fragment on the lateral view. Electronically Signed   By: Charlett Nose M.D.   On: 09/29/2017 10:13     CBC Recent Labs  Lab 09/25/17 2140 09/26/17 0624 09/28/17 1356  WBC 7.9 7.8 8.2  HGB 12.6 11.0* 10.2*  HCT 38.1 33.0* 31.1*  PLT 242 193 178  MCV 99.2 98.8 100.0  MCH 32.8 32.9 32.8  MCHC 33.1 33.3 32.8  RDW 13.2 13.3 13.5  LYMPHSABS 2.8  --   --   MONOABS 0.6  --   --   EOSABS 0.1  --   --   BASOSABS 0.0  --   --     Chemistries  Recent Labs  Lab 09/25/17 2140 09/26/17 0624 09/28/17 1356  NA 138 139  --   K 3.8 3.6  --   CL 104 107  --   CO2 25 23  --   GLUCOSE 120* 151*  --   BUN 17 15  --   CREATININE 0.61 0.59 0.63  CALCIUM 9.0 8.4*  --   AST  --  27  --   ALT  --  26  --   ALKPHOS  --  66  --   BILITOT  --  0.6  --    ------------------------------------------------------------------------------------------------------------------ No results for input(s): CHOL, HDL, LDLCALC, TRIG, CHOLHDL, LDLDIRECT in the last 72 hours.  No results found for: HGBA1C ------------------------------------------------------------------------------------------------------------------ No results for input(s): TSH, T4TOTAL, T3FREE, THYROIDAB in the last 72 hours.  Invalid input(s): FREET3 ------------------------------------------------------------------------------------------------------------------ No results for  input(s): VITAMINB12, FOLATE, FERRITIN, TIBC, IRON, RETICCTPCT in the last 72 hours.  Coagulation profile Recent Labs  Lab 09/27/17 0635 09/27/17 1215 09/28/17 0601 09/28/17 1356 09/29/17 0512  INR 1.26 1.15 1.16 1.12 1.16    No results for input(s): DDIMER in the last 72 hours.  Cardiac Enzymes Recent Labs  Lab 09/26/17 0624  TROPONINI <0.03   ------------------------------------------------------------------------------------------------------------------ No results found for: BNP   Shon Hale M.D on 09/29/2017 at 5:45 PM  Between 7am to 7pm - Pager - [phone]  After 7pm go to [email or web address] - password [email or web address]  [facility] -  Office  [phone]   Voice Recognition Viviann Spare dictation system was used to create this note, attempts have been made to correct errors. Please contact the author with questions and/or clarifications.

## 2017-09-29 NOTE — Progress Notes (Signed)
ANTICOAGULATION CONSULT NOTE -f/u Consult  Pharmacy Consult for Coumadin Indication: h/o afib and VTE prophx.  Allergies  Allergen Reactions  . Codeine Other (See Comments)    Pt reports allergy but not documented    Patient Measurements: Height: 5\' 5"  (165.1 cm) Weight: 127 lb 6.8 oz (57.8 kg) IBW/kg (Calculated) : 57  Vital Signs: Temp: 98.4 F (36.9 C) (03/27 0705) Temp Source: Oral (03/27 0705) BP: 149/52 (03/27 0705) Pulse Rate: 72 (03/27 0705)  Labs: Recent Labs    09/28/17 0601 09/28/17 1356 09/29/17 0512  HGB  --  10.2*  --   HCT  --  31.1*  --   PLT  --  178  --   LABPROT 14.7 14.3 14.7  INR 1.16 1.12 1.16  CREATININE  --  0.63  --     Estimated Creatinine Clearance: 41.2 mL/min (by C-G formula based on SCr of 0.63 mg/dL).   Medical History: History reviewed. No pertinent past medical history.  Assessment: CC/HPI: fall with RLE fracture  PMH: HTN, GERD, CAD, chronic anticoag, hypothyroid, depression  Significant events: 3/26 ORIF femoral shaft fx.  Anticoag: Coumadin PTA for h/o afib, last dose 3/23 with INR 1.91 >> held for surgical repair of femoral fx. INR 1.16 today (pt on carbamazepine: enzyme inducer) - 3/24: Vit K 5mg  IV  - PTA dose: 1.5mg  TTh, and 2mg  all other days.  Goal of Therapy:  INR 2-3 Monitor platelets by anticoagulation protocol: Yes   Plan:  Coumadin 3mg  po x 1 again tonight Lovenox 40mg /24h until INR>2 Daily INR    Tikisha Molinaro S. , PharmD, BCPS Clinical Staff Pharmacist Pager [phone]  [place] 09/29/2017,11:01 AM

## 2017-09-29 NOTE — Evaluation (Signed)
Occupational Therapy Evaluation Patient Details Name: Maria Cameron MRN: [id number] DOB: 1924-02-24 Today's Date: 09/29/2017    History of Present Illness Maria Cameron is a 84 y.o. female with medical history significant of HTN, GERD, Chronic anticoagulation and CAD who was in ALF and sustained a fall when she walked to the bathroom in the dark. She tripped and fell. Sustained right proximal comminuted Femural fracture. Now s/p surgery for fixation, NWB   Clinical Impression   This 84 y/o F presents with the above. Pt resides in ALF, reports mod independence with ADLs and functional mobility at baseline. Pt presenting with pain in RLE, impacting her activity tolerance, sitting and standing balance. Pt requiring MaxA+2 for bed mobility and for stand pivot transfers this session; currently requires max-total assist for LB and toileting ADLs. Pt will benefit from SNF level services after discharge to maximize her safety and independence with ADLs and mobility. Will continue to follow acutely.     Follow Up Recommendations  SNF;Supervision/Assistance - 24 hour    Equipment Recommendations  Other (comment)(TBD in next venue )           Precautions / Restrictions Precautions Precautions: Fall Restrictions Weight Bearing Restrictions: Yes RLE Weight Bearing: Non weight bearing      Mobility Bed Mobility Overal bed mobility: Needs Assistance Bed Mobility: Supine to Sit     Supine to sit: +2 for physical assistance;+2 for safety/equipment;Max assist     General bed mobility comments: helicopter method utilized with pt self assisting using hand rails; assist provided to support RLE while advancing LEs over EOB and to bring trunk upright into sitting  Transfers Overall transfer level: Needs assistance   Transfers: Squat Pivot Transfers     Squat pivot transfers: Max assist;+2 physical assistance;+2 safety/equipment     General transfer comment: +2 assist to rise and pivot on LLE to  recliner (transferring to the L)     Balance Overall balance assessment: Needs assistance   Sitting balance-Leahy Scale: Poor Sitting balance - Comments: pt intermittently maintains static sitting, though often leaning heavily to the L to offset pressure in R hip, requires assist and cues to correct      Standing balance-Leahy Scale: Zero                             ADL either performed or assessed with clinical judgement   ADL Overall ADL's : Needs assistance/impaired Eating/Feeding: Set up;Sitting   Grooming: Wash/dry face;Set up;Sitting Grooming Details (indicate cue type and reason): supported sitting in recliner  Upper Body Bathing: Minimal assistance;Sitting   Lower Body Bathing: Maximal assistance;Sitting/lateral leans;+2 for physical assistance;+2 for safety/equipment   Upper Body Dressing : Minimal assistance;Sitting   Lower Body Dressing: Maximal assistance;+2 for physical assistance;+2 for safety/equipment;Sitting/lateral leans   Toilet Transfer: Maximal assistance;+2 for physical assistance;+2 for safety/equipment;Squat-pivot;BSC Toilet Transfer Details (indicate cue type and reason): simulated in transfer to recliner  Toileting- Clothing Manipulation and Hygiene: Maximal assistance;Sitting/lateral lean;+2 for physical assistance;+2 for safety/equipment       Functional mobility during ADLs: Maximal assistance;+2 for physical assistance;+2 for safety/equipment(for squat pivot) General ADL Comments: pt performing bed mobility and squat pivot to recliner, set up for simple grooming ADLs; also built up call bell due to pt's low vision to increase ease of finding call button     Vision Baseline Vision/History: Legally blind Additional Comments: pt requires multimodal cues due to low vision  Pertinent Vitals/Pain Pain Assessment: Faces Faces Pain Scale: Hurts whole lot Pain Location: R LE with movement  Pain Descriptors / Indicators:  Crying;Guarding;Grimacing Pain Intervention(s): Limited activity within patient's tolerance;Monitored during session;Repositioned;Patient requesting pain meds-RN notified     Hand Dominance Right   Extremity/Trunk Assessment Upper Extremity Assessment Upper Extremity Assessment: Generalized weakness   Lower Extremity Assessment Lower Extremity Assessment: Defer to PT evaluation       Communication Communication Communication: No difficulties   Cognition Arousal/Alertness: Awake/alert Behavior During Therapy: WFL for tasks assessed/performed Overall Cognitive Status: Within Functional Limits for tasks assessed                                 General Comments: pt does require multimodal cues at times to follow instructions, though suspect partly due to pt's low vision (blind) vs cognition    General Comments                  Home Living Family/patient expects to be discharged to:: Assisted living                             Home Equipment: Bedside commode;Shower seat          Prior Functioning/Environment Level of Independence: Independent with assistive device(s)                 OT Problem List: Decreased strength;Impaired balance (sitting and/or standing);Decreased range of motion;Decreased activity tolerance;Decreased knowledge of use of DME or AE;Pain      OT Treatment/Interventions: Self-care/ADL training;Therapeutic activities;DME and/or AE instruction;Balance training;Therapeutic exercise;Energy conservation;Patient/family education    OT Goals(Current goals can be found in the care plan section) Acute Rehab OT Goals Patient Stated Goal: less pain OT Goal Formulation: With patient Time For Goal Achievement: 10/13/17 Potential to Achieve Goals: Good  OT Frequency: Min 2X/week               Co-evaluation PT/OT/SLP Co-Evaluation/Treatment: Yes Reason for Co-Treatment: Complexity of the patient's impairments  (multi-system involvement);For patient/therapist safety;To address functional/ADL transfers   OT goals addressed during session: ADL's and self-care      AM-PAC PT "6 Clicks" Daily Activity     Outcome Measure Help from another person eating meals?: A Little Help from another person taking care of personal grooming?: A Little Help from another person toileting, which includes using toliet, bedpan, or urinal?: Total Help from another person bathing (including washing, rinsing, drying)?: A Lot Help from another person to put on and taking off regular upper body clothing?: A Little Help from another person to put on and taking off regular lower body clothing?: Total 6 Click Score: 13   End of Session Equipment Utilized During Treatment: Gait belt Nurse Communication: Mobility status  Activity Tolerance: Patient tolerated treatment well;Patient limited by pain Patient left: in chair;with call bell/phone within reach;with chair alarm set  OT Visit Diagnosis: Pain;History of falling (Z91.81);Muscle weakness (generalized) (M62.81) Pain - Right/Left: Right Pain - part of body: Hip                Time: 8101-7510 OT Time Calculation (min): 28 min Charges:  OT General Charges $OT Visit: 1 Visit OT Evaluation $OT Eval Moderate Complexity: 1 Mod G-Codes:     Marcy Siren, OT Pager [phone] 09/29/2017   Orlando Penner 09/29/2017, 1:11 PM

## 2017-09-30 LAB — CBC
HCT: 31.1 % — ABNORMAL LOW (ref 36.0–46.0)
Hemoglobin: 10.2 g/dL — ABNORMAL LOW (ref 12.0–15.0)
MCH: 33.6 pg (ref 26.0–34.0)
MCHC: 32.8 g/dL (ref 30.0–36.0)
MCV: 102.3 fL — ABNORMAL HIGH (ref 78.0–100.0)
Platelets: 201 10*3/uL (ref 150–400)
RBC: 3.04 MIL/uL — ABNORMAL LOW (ref 3.87–5.11)
RDW: 13.9 % (ref 11.5–15.5)
WBC: 6.9 10*3/uL (ref 4.0–10.5)

## 2017-09-30 MED ORDER — ASPIRIN 81 MG PO CHEW: 81.0000 mg | CHEWABLE_TABLET | Freq: Every day | ORAL | 1 refills | Status: DC

## 2017-09-30 MED ORDER — LORAZEPAM 0.5 MG PO TABS: 0.2500 mg | ORAL_TABLET | Freq: Every day | ORAL | 0 refills | Status: DC | PRN

## 2017-09-30 MED ORDER — METOPROLOL TARTRATE 25 MG PO TABS: 25.0000 mg | ORAL_TABLET | Freq: Two times a day (BID) | ORAL | 11 refills | Status: DC

## 2017-09-30 MED ORDER — ENOXAPARIN SODIUM 40 MG/0.4ML ~~LOC~~ SOLN: 40.0000 mg | SUBCUTANEOUS | 0 refills | Status: DC

## 2017-09-30 MED ORDER — HYDROCODONE-ACETAMINOPHEN 7.5-325 MG PO TABS: 1.0000 | ORAL_TABLET | ORAL | 0 refills | Status: DC | PRN

## 2017-09-30 NOTE — Social Work (Signed)
Clinical Social Worker facilitated patient discharge including contacting patient family and facility to confirm patient discharge plans.  Clinical information faxed to facility and family agreeable with plan.    CSW arranged ambulance transport via [facility] to [facility].    RN to call [phone] to give report prior to discharge. Pt going to Room 607.  Clinical Social Worker will sign off for now as social work intervention is no longer needed. Please consult [place] again if new need arises.  Keene Breath, LCSW Clinical Social Worker [phone]

## 2017-09-30 NOTE — Clinical Social Work Placement (Signed)
   CLINICAL SOCIAL WORK PLACEMENT  NOTE  Date:  09/30/2017  Patient Details  Name: Natallia Stellmach MRN: [id number] Date of Birth: 05-26-1924  Clinical Social Work is seeking post-discharge placement for this patient at the Skilled  Nursing Facility level of care (*CSW will initial, date and re-position this form in  chart as items are completed):  Yes   Patient/family provided with [facility] Clinical Social Work Department's list of facilities offering this level of care within the geographic area requested by the patient (or if unable, by the patient's family).  Yes   Patient/family informed of their freedom to choose among providers that offer the needed level of care, that participate in Medicare, Medicaid or managed care program needed by the patient, have an available bed and are willing to accept the patient.  Yes   Patient/family informed of [facility]'s ownership interest in [facility] and [facility], as well as of the fact that they are under no obligation to receive care at these facilities.  PASRR submitted to EDS on       PASRR number received on       Existing PASRR number confirmed on 09/28/17     FL2 transmitted to all facilities in geographic area requested by pt/family on       FL2 transmitted to all facilities within larger geographic area on 09/28/17     Patient informed that his/her managed care company has contracts with or will negotiate with certain facilities, including the following:        Yes   Patient/family informed of bed offers received.  Patient chooses bed at [facility]     Physician recommends and patient chooses bed at      Patient to be transferred to [facility], [place] on 09/30/17.  Patient to be transferred to facility by [facility]     Patient family notified on 09/30/17 of transfer.  Name of family member notified:  daughter, Gray Bernhardt contacted     PHYSICIAN       Additional Comment:     _______________________________________________ Tresa Moore, LCSW 09/30/2017, 2:34 PM

## 2017-09-30 NOTE — Discharge Summary (Signed)
Maria Cameron, is a 84 y.o. female  DOB 16-Apr-1924  MRN [id number].  Admission date:  09/25/2017  Admitting Physician  Rometta Emery, MD  Discharge Date:  09/30/2017   Primary MD  No primary care provider on file.  Recommendations for primary care physician for things to follow:   1)Activity level as per orthopedic surgeon's instructions 2)Take baby aspirin daily with food for stroke prophylaxis in the setting of atrial fibrillation 3)Take Lovenox subcu for DVT prophylaxis  in the post operative patient (For 30 days, then PCP to Reevaluate) 4)Repeat CBC and BMP on Monday on 10/04/17  Admission Diagnosis  Fracture [T14.8XXA] Closed displaced comminuted fracture of shaft of right femur, initial encounter (HCC) [S72.351A] Fall, initial encounter [W19.XXXA]   Discharge Diagnosis  Fracture [T14.8XXA] Closed displaced comminuted fracture of shaft of right femur, initial encounter (HCC) [S72.351A] Fall, initial encounter [W19.XXXA]    Principal Problem:   Right femoral fracture (HCC) Active Problems:   Warfarin-induced coagulopathy (HCC)   CAD (coronary artery disease)   Hypothyroid   GERD (gastroesophageal reflux disease)   Depression   Displaced comminuted fracture of shaft of right femur, initial encounter for closed fracture (HCC)         HPI  from the history and physical done on the day of admission:     Patient coming from: ALF  Chief Complaint: Fall with right LE Fracture  HPI: Maria Cameron is a 84 y.o. female with medical history significant of HTN, GERD, Chronic anticoagulation and CAD who was in ALF and sustained a fall when she walked to the bathroom in the dark. She tripped and fell. Sustained right proximal comminuted Femural fracture.  Patient has no loss of consciousness. No other fractures or injury. On so many medications that have not been reviewed for years. Has CAD but stable for  20 years with no Cardiac events. Had 3 stents back then. No prior Orthopedics injury, she was fully functional requiring minimal assistance.  ED Course: Patient evaluated and Orthopedics called. Has X ray showing Acute, comminuted fracture involving the proximal femoral diaphysis with dorsal angulation of the distal fracture fragment and internal rotation of the. 1/4 shaft width anterior and lateral displacement is also noted of the distal fracture fragment. INR is 1.95.     Hospital Course:    1)Right femur fracture: X ray showingrightacute, comminuted fracture involving the proximal femoral diaphysis with dorsal angulation of the distal fracture fragment and internal rotation, She underwent open reduction and internal fixation on 09/28/17, PT OT consult appreciated,, transfer to skilled nursing facility, Lovenox 40 mg daily subcu for DVT prophylaxis for 1 month only, d/w Dr Carola Frost on 09/30/17 prior to discharge  2)Elevated INR-  She has high risks of falls and that poses significant morbidity especially when she is on warfarin.  After discussion with patient and daughter they have decided to stop warfarin for now and use aspirin 81 mg daily for primary stroke prophylaxis, and use Lovenox subcu 40 mg daily for 1 month for DVT prophylaxis in the  post hip surgery patient , once DVT prophylaxis dose of Lovenox if discontinued patient will discuss with PCP about risk versus benefit of going on Coumadin for stroke prophylaxis due to history of atrial fibrillation   3)History of A. Fib/CAD: Patient was on Coumadin at home, she will be on Lovenox subcu for DVT prophylaxis due to hip surgery for now, use aspirin, metoprolol and Lipitor for CAD for now.  Patient and daughter will talk to PCP post discharge from rehab about Risk Versus benefit of Coumadin anticoagulation once she is off the Lovenox (patient is to be on Lovenox 40 mg daily for 30 days for DVT prophylaxis in the post femoral repair  patient)  4)Hypothyroidism: Continue levothyroxine  5)Hypertension: Stable, continue amlodipine 5 mg daily and   metoprolol 25 mg twice daily  6)Depression: Continue Lexapro and Remeron     DVT prophylaxis: Lovenox for 30 days  Code Status:Full Family Communication:daughter at [place]  Disposition Plan: SNF   Consultants:Orthopedics Dr Carola Frost   Discharge Condition: stable  Follow UP-orthopedic surgeon as advised   Diet and Activity recommendation:  As advised  Discharge Instructions     Discharge Instructions    Call MD for:  difficulty breathing, headache or visual disturbances   Complete by:  As directed    Call MD for:  persistant dizziness or light-headedness   Complete by:  As directed    Call MD for:  persistant nausea and vomiting   Complete by:  As directed    Call MD for:  severe uncontrolled pain   Complete by:  As directed    Call MD for:  temperature >100.4   Complete by:  As directed    Diet - low sodium heart healthy   Complete by:  As directed    Discharge instructions   Complete by:  As directed    1)Activity level as per orthopedic surgeon's instructions 2)Take baby aspirin daily with food for stroke prophylaxis in the setting of atrial fibrillation 3)Take Lovenox subcu for DVT prophylaxis in the post operative patient 4)Repeat CBC and BMP on Monday on 10/04/17   Increase activity slowly   Complete by:  As directed         Discharge Medications     Allergies as of 09/30/2017      Reactions   Codeine Other (See Comments)   Pt reports allergy but not documented      Medication List    STOP taking these medications   docusate sodium 100 MG capsule Commonly known as:  COLACE   traMADol 50 MG tablet Commonly known as:  ULTRAM   warfarin 1 MG tablet Commonly known as:  COUMADIN     TAKE these medications   acetaminophen 325 MG tablet Commonly known as:  TYLENOL Take 650 mg by mouth every 6 (six) hours as needed for mild  pain or fever. What changed:  Another medication with the same name was removed. Continue taking this medication, and follow the directions you see here.   albuterol 108 (90 Base) MCG/ACT inhaler Commonly known as:  PROVENTIL HFA;VENTOLIN HFA Inhale 2 puffs into the lungs every 6 (six) hours as needed for wheezing or shortness of breath.   amLODipine 5 MG tablet Commonly known as:  NORVASC Take 5 mg by mouth daily.   ARIPiprazole 5 MG tablet Commonly known as:  ABILIFY Take 2.5 mg by mouth daily.   aspirin 81 MG chewable tablet Chew 1 tablet (81 mg total) by mouth daily. With food  Start taking on:  10/01/2017   atorvastatin 20 MG tablet Commonly known as:  LIPITOR Take 20 mg by mouth at bedtime.   bimatoprost 0.01 % Soln Commonly known as:  LUMIGAN Place 1 drop into both eyes at bedtime.   carbamazepine 100 MG chewable tablet Commonly known as:  TEGRETOL Chew 100 mg by mouth 2 (two) times daily.   dicyclomine 10 MG capsule Commonly known as:  BENTYL Take 10 mg by mouth 4 (four) times daily -  before meals and at bedtime.   diphenhydrAMINE 25 MG tablet Commonly known as:  BENADRYL Take 25 mg by mouth every 4 (four) hours as needed for allergies.   enoxaparin 40 MG/0.4ML injection Commonly known as:  LOVENOX Inject 0.4 mLs (40 mg total) into the skin daily. For 30 days, then PCP to Reevaluate Start taking on:  10/01/2017   escitalopram 10 MG tablet Commonly known as:  LEXAPRO Take 10 mg by mouth daily.   guaifenesin 100 MG/5ML syrup Commonly known as:  ROBITUSSIN Take 200 mg by mouth 4 (four) times daily as needed for cough.   HYDROcodone-acetaminophen 7.5-325 MG tablet Commonly known as:  NORCO Take 1 tablet by mouth every 4 (four) hours as needed for moderate pain or severe pain (pain score 7-10).   levothyroxine 25 MCG tablet Commonly known as:  SYNTHROID, LEVOTHROID Take 25 mcg by mouth daily before breakfast.   loperamide 2 MG capsule Commonly known as:   IMODIUM Take 2 mg by mouth as needed for diarrhea or loose stools.   LORazepam 0.5 MG tablet Commonly known as:  ATIVAN Take 0.5 tablets (0.25 mg total) by mouth daily as needed for anxiety.   Melatonin 3 MG Tabs Take 3 mg by mouth at bedtime.   metoprolol tartrate 25 MG tablet Commonly known as:  LOPRESSOR Take 1 tablet (25 mg total) by mouth 2 (two) times daily.   mirtazapine 7.5 MG tablet Commonly known as:  REMERON Take 7.5 mg by mouth at bedtime.   pantoprazole 40 MG tablet Commonly known as:  PROTONIX Take 40 mg by mouth daily.   polyethylene glycol packet Commonly known as:  MIRALAX / GLYCOLAX Take 17 g by mouth daily.   REFRESH PLUS 0.5 % Soln Generic drug:  Carboxymethylcellulose Sod PF Place 1 drop into both eyes daily as needed (for eye irritation).   simethicone 80 MG chewable tablet Commonly known as:  MYLICON Chew 80 mg by mouth 3 (three) times daily as needed for flatulence.       Major procedures and Radiology Reports - PLEASE review detailed and final reports for all details, in brief -    Dg Chest 1 View  Result Date: 09/25/2017 CLINICAL DATA:  Unwitnessed fall preop. EXAM: CHEST  1 VIEW COMPARISON:  None. FINDINGS: Low lung volumes without acute pneumonic consolidation or CHF. No effusion. Heart size is normal with aortic atherosclerosis. Left-sided pacemaker apparatus with right atrial and right ventricular leads are noted. IMPRESSION: No active disease.  Aortic atherosclerosis. Electronically Signed   By: Tollie Eth M.D.   On: 09/25/2017 22:59   Ct Head Wo Contrast  Result Date: 09/25/2017 CLINICAL DATA:  Unwitnessed fall in the bathroom this evening. EXAM: CT HEAD WITHOUT CONTRAST CT CERVICAL SPINE WITHOUT CONTRAST TECHNIQUE: Multidetector CT imaging of the head and cervical spine was performed following the standard protocol without intravenous contrast. Multiplanar CT image reconstructions of the cervical spine were also generated. COMPARISON:   None. FINDINGS: CT HEAD FINDINGS BRAIN: There is sulcal  and ventricular prominence consistent with superficial and central atrophy. No intraparenchymal hemorrhage, mass effect nor midline shift. Periventricular and subcortical white matter hypodensities consistent with chronic small vessel ischemic disease are identified. No acute large vascular territory infarcts. No abnormal extra-axial fluid collections. Basal cisterns are not effaced and midline. VASCULAR: Moderate calcific atherosclerosis of the carotid siphons. SKULL: No skull fracture. No significant scalp soft tissue swelling. SINUSES/ORBITS: The mastoid air-cells are clear. Right maxillary sinus atelectasis with small appearing sinus cavity completely filled with mucus. Right maxillary sinus wall thickening is also noted compatible with chronic sinusitis. Included ocular globes and orbital contents are non-suspicious. Right cataract extraction. OTHER: None. CT CERVICAL SPINE FINDINGS Alignment: Maintained cervical lordosis. Skull base and vertebrae: Osteopenia without acute cervical spine fracture. Intact skull base. Soft tissues and spinal canal: No prevertebral fluid or swelling. No visible canal hematoma. Disc levels: Marked disc space narrowing from C2 through C6 with small posterior marginal osteophytes. No jumped or perched facets. Uncovertebral joint osteoarthritis is seen bilaterally from C2 through C6. No significant central or neural foraminal encroachment. Upper chest: Apical pleuroparenchymal scarring bilaterally. Other: None IMPRESSION: 1. Atrophy with chronic white matter small vessel ischemia. No acute intracranial abnormality. 2. Right maxillary sinus atelectasis with complete opacification of the included maxillary sinus. Given wall thickening, findings are in keeping with chronic sinusitis. 3. Cervical spondylosis with marked disc space narrowing from C2 through C6. Uncovertebral joint osteoarthritis is also noted as well as facet  arthropathy. No acute cervical spine fracture. Electronically Signed   By: Tollie Eth M.D.   On: 09/25/2017 22:23   Ct Cervical Spine Wo Contrast  Result Date: 09/25/2017 CLINICAL DATA:  Unwitnessed fall in the bathroom this evening. EXAM: CT HEAD WITHOUT CONTRAST CT CERVICAL SPINE WITHOUT CONTRAST TECHNIQUE: Multidetector CT imaging of the head and cervical spine was performed following the standard protocol without intravenous contrast. Multiplanar CT image reconstructions of the cervical spine were also generated. COMPARISON:  None. FINDINGS: CT HEAD FINDINGS BRAIN: There is sulcal and ventricular prominence consistent with superficial and central atrophy. No intraparenchymal hemorrhage, mass effect nor midline shift. Periventricular and subcortical white matter hypodensities consistent with chronic small vessel ischemic disease are identified. No acute large vascular territory infarcts. No abnormal extra-axial fluid collections. Basal cisterns are not effaced and midline. VASCULAR: Moderate calcific atherosclerosis of the carotid siphons. SKULL: No skull fracture. No significant scalp soft tissue swelling. SINUSES/ORBITS: The mastoid air-cells are clear. Right maxillary sinus atelectasis with small appearing sinus cavity completely filled with mucus. Right maxillary sinus wall thickening is also noted compatible with chronic sinusitis. Included ocular globes and orbital contents are non-suspicious. Right cataract extraction. OTHER: None. CT CERVICAL SPINE FINDINGS Alignment: Maintained cervical lordosis. Skull base and vertebrae: Osteopenia without acute cervical spine fracture. Intact skull base. Soft tissues and spinal canal: No prevertebral fluid or swelling. No visible canal hematoma. Disc levels: Marked disc space narrowing from C2 through C6 with small posterior marginal osteophytes. No jumped or perched facets. Uncovertebral joint osteoarthritis is seen bilaterally from C2 through C6. No significant  central or neural foraminal encroachment. Upper chest: Apical pleuroparenchymal scarring bilaterally. Other: None IMPRESSION: 1. Atrophy with chronic white matter small vessel ischemia. No acute intracranial abnormality. 2. Right maxillary sinus atelectasis with complete opacification of the included maxillary sinus. Given wall thickening, findings are in keeping with chronic sinusitis. 3. Cervical spondylosis with marked disc space narrowing from C2 through C6. Uncovertebral joint osteoarthritis is also noted as well as facet arthropathy. No acute cervical  spine fracture. Electronically Signed   By: Tollie Eth M.D.   On: 09/25/2017 22:23   Dg C-arm 1-60 Min  Result Date: 09/28/2017 CLINICAL DATA:  ORIF of right femoral fracture EXAM: DG C-ARM 61-120 MIN; RIGHT FEMUR 2 VIEWS COMPARISON:  09/25/2017 FLUOROSCOPY TIME:  Fluoroscopy Time:  1 minutes 26 seconds Radiation Exposure Index (if provided by the fluoroscopic device): 6.97 mGy Number of Acquired Spot Images: 6 FINDINGS: Right hip prosthesis is again identified. Fixation sideplate is noted along the lateral aspect of the femur with multiple proximal and distal fixation screws. The fracture fragments are in near anatomic alignment. No gross soft tissue abnormality is noted. IMPRESSION: ORIF of right midshaft femoral fracture Electronically Signed   By: Alcide Clever M.D.   On: 09/28/2017 12:56   Dg C-arm 1-60 Min  Result Date: 09/28/2017 CLINICAL DATA:  ORIF of right femoral fracture EXAM: DG C-ARM 61-120 MIN; RIGHT FEMUR 2 VIEWS COMPARISON:  09/25/2017 FLUOROSCOPY TIME:  Fluoroscopy Time:  1 minutes 26 seconds Radiation Exposure Index (if provided by the fluoroscopic device): 6.97 mGy Number of Acquired Spot Images: 6 FINDINGS: Right hip prosthesis is again identified. Fixation sideplate is noted along the lateral aspect of the femur with multiple proximal and distal fixation screws. The fracture fragments are in near anatomic alignment. No gross soft  tissue abnormality is noted. IMPRESSION: ORIF of right midshaft femoral fracture Electronically Signed   By: Alcide Clever M.D.   On: 09/28/2017 12:56   Dg Hip Unilat W Or Wo Pelvis 2-3 Views Right  Result Date: 09/25/2017 CLINICAL DATA:  Unwitnessed fall.  Right hip and leg pain. EXAM: DG HIP (WITH OR WITHOUT PELVIS) 2-3V RIGHT COMPARISON:  None. FINDINGS: No acute laterally displaced fracture of the femoral diaphysis with dorsal angulation is noted just distal to the cemented right hip arthroplasty. The fracture undermines the tip of the femoral cement. No joint dislocation is seen. There is lower lumbar degenerative disc and facet arthropathy. Bony pelvis appears intact. The native left hip joint is maintained. IMPRESSION: Acute, closed, dorsally angulated and laterally displaced fracture of the femoral diaphysis. Electronically Signed   By: Tollie Eth M.D.   On: 09/25/2017 22:49   Dg Femur, Min 2 Views Right  Result Date: 09/28/2017 CLINICAL DATA:  ORIF of right femoral fracture EXAM: DG C-ARM 61-120 MIN; RIGHT FEMUR 2 VIEWS COMPARISON:  09/25/2017 FLUOROSCOPY TIME:  Fluoroscopy Time:  1 minutes 26 seconds Radiation Exposure Index (if provided by the fluoroscopic device): 6.97 mGy Number of Acquired Spot Images: 6 FINDINGS: Right hip prosthesis is again identified. Fixation sideplate is noted along the lateral aspect of the femur with multiple proximal and distal fixation screws. The fracture fragments are in near anatomic alignment. No gross soft tissue abnormality is noted. IMPRESSION: ORIF of right midshaft femoral fracture Electronically Signed   By: Alcide Clever M.D.   On: 09/28/2017 12:56   Dg Femur Min 2 Views Right  Result Date: 09/25/2017 CLINICAL DATA:  Unwitnessed fall.  Leg pain. EXAM: RIGHT FEMUR 2 VIEWS COMPARISON:  None. FINDINGS: An acute, comminuted fracture involving the proximal femoral diaphysis undermining the cement-bone interface of a pre-existing right hip arthroplasty is  identified. There is dorsal angulation of the distal fracture fragment with internal rotation of the knee seen on the frog-leg view. There is anterior and lateral displacement 1/4 shaft width of the distal fracture fragment. IMPRESSION: Acute, comminuted fracture involving the proximal femoral diaphysis with dorsal angulation of the distal fracture fragment and internal  rotation of the. 1/4 shaft width anterior and lateral displacement is also noted of the distal fracture fragment. Electronically Signed   By: Tollie Eth M.D.   On: 09/25/2017 22:55   Dg Femur [place] 2 Views Right  Result Date: 09/29/2017 CLINICAL DATA:  Femur fracture repair EXAM: RIGHT FEMUR PORTABLE 2 VIEW COMPARISON:  Intraoperative imaging 09/28/2017 FINDINGS: Plate and screw fixation across the mid shaft right femoral fracture. Continued anterior displacement of the fracture fragment on the lateral view. Hardware from prior right hip replacement noted. IMPRESSION: Internal fixation across the midshaft right femoral fracture. Continued anterior displacement of the distal fragment on the lateral view. Electronically Signed   By: Charlett Nose M.D.   On: 09/29/2017 10:13    Micro Results    Recent Results (from the past 240 hour(s))  Surgical pcr screen     Status: Abnormal   Collection Time: 09/26/17 12:13 PM  Result Value Ref Range Status   MRSA, PCR POSITIVE (A) NEGATIVE Final   Staphylococcus aureus POSITIVE (A) NEGATIVE Final    Comment: RESULT CALLED TO, READ BACK BY AND VERIFIED WITH: R ARCILLA,RN AT 1641 09/26/17 BY L BENFIELD (NOTE) The Xpert SA Assay (FDA approved for NASAL specimens in patients 58 years of age and older), is one component of a comprehensive surveillance program. It is not intended to diagnose infection nor to guide or monitor treatment. Performed at [facility], [address], [place], [place] [address]        Today   Subjective    Alexiz Cothran today has no new complaints, oral  intake is fair, no chest pains no palpitations          Patient has been seen and examined prior to discharge   Objective   Blood pressure (!) 143/76, pulse 76, temperature 98.8 F (37.1 C), temperature source Oral, resp. rate 16, height 5\' 5"  (1.651 m), weight 57.8 kg (127 lb 6.8 oz), SpO2 100 %.   Intake/Output Summary (Last 24 hours) at 09/30/2017 1359 Last data filed at 09/30/2017 0900 Gross per 24 hour  Intake 120 ml  Output 400 ml  Net -280 ml    Exam Gen:- Awake Alert,   in no acute distress HEENT:- [place].AT, No sclera icterus Neck-Supple Neck,No JVD,.  Lungs-  CTAB  CV- S1, S2 normal, irregularly irregular Abd-  +ve B.Sounds, Abd Soft, No tenderness,    Extremity/Skin:- No  edema,   left thigh /leg dressing on Psych-affect is appropriate, oriented x3 Neuro-no new focal deficits, no tremors    Data Review   CBC w Diff:  Lab Results  Component Value Date   WBC 6.9 09/30/2017   HGB 10.2 (L) 09/30/2017   HCT 31.1 (L) 09/30/2017   PLT 201 09/30/2017   LYMPHOPCT 35 09/25/2017   MONOPCT 7 09/25/2017   EOSPCT 1 09/25/2017   BASOPCT 0 09/25/2017    CMP:  Lab Results  Component Value Date   NA 139 09/26/2017   K 3.6 09/26/2017   CL 107 09/26/2017   CO2 23 09/26/2017   BUN 15 09/26/2017   CREATININE 0.63 09/28/2017   PROT 5.5 (L) 09/26/2017   ALBUMIN 3.2 (L) 09/26/2017   BILITOT 0.6 09/26/2017   ALKPHOS 66 09/26/2017   AST 27 09/26/2017   ALT 26 09/26/2017  .   Total Discharge time is about 33 minutes  09/28/2017 M.D on 09/30/2017 at 1:59 PM  [facility]   Office  [phone]  Voice Recognition [phone]  dictation system was used to create this note, attempts have been made to correct errors. Please contact the author with questions and/or clarifications.

## 2017-09-30 NOTE — [place] FL2 (Signed)
[place] MEDICAID FL2 LEVEL OF CARE SCREENING TOOL     IDENTIFICATION  Patient Name: Maria Cameron Birthdate: May 12, 1924 Sex: female Admission Date (Current Location): 09/25/2017  [facility] and [place] Number:  [employer] and Address:  [facility], [address], [place], [place] [address]      Provider Number: [id number]  Attending Physician Name and Address:  Shon Hale, MD  Relative Name and Phone Number:  Eliott Nine, daughter, [phone]     Current Level of Care: Hospital Recommended Level of Care: Skilled Nursing Facility Prior Approval Number:    Date Approved/Denied:   PASRR Number: [id number]  Discharge Plan: SNF    Current Diagnoses: Patient Active Problem List   Diagnosis Date Noted  . Displaced comminuted fracture of shaft of right femur, initial encounter for closed fracture (HCC) 09/28/2017  . Right femoral fracture (HCC) 09/25/2017  . Warfarin-induced coagulopathy (HCC) 09/25/2017  . CAD (coronary artery disease) 09/25/2017  . Hypothyroid 09/25/2017  . GERD (gastroesophageal reflux disease) 09/25/2017  . Depression 09/25/2017    Orientation RESPIRATION BLADDER Height & Weight     Self  O2(Nasal Cannula 2L) Incontinent Weight: 127 lb 6.8 oz (57.8 kg) Height:  5\' 5"  (165.1 cm)  BEHAVIORAL SYMPTOMS/MOOD NEUROLOGICAL BOWEL NUTRITION STATUS      Continent Diet(See DC Summary)  AMBULATORY STATUS COMMUNICATION OF NEEDS Skin   Extensive Assist Verbally Surgical wounds                       Personal Care Assistance Level of Assistance  Dressing, Feeding, Bathing Bathing Assistance: Maximum assistance Feeding assistance: Limited assistance Dressing Assistance: Maximum assistance     Functional Limitations Info  Sight, Hearing, Speech Sight Info: Impaired Hearing Info: Adequate Speech Info: Adequate    SPECIAL CARE FACTORS FREQUENCY  PT (By licensed PT), OT (By licensed OT)     PT Frequency: 5x  weekk OT Frequency: 5x week            Contractures      Additional Factors Info  Code Status, Allergies, Isolation Precautions, Psychotropic Code Status Info: DNR Allergies Info: Codeine Psychotropic Info: Lexapro, Remeron, Abilify   Isolation Precautions Info: MRSA     Current Medications (09/30/2017):  This is the current hospital active medication list Current Facility-Administered Medications  Medication Dose Route Frequency Provider Last Rate Last Dose  . 0.9 %  sodium chloride infusion   Intravenous Continuous 10/02/2017, MD 75 mL/hr at 09/28/17 2008    . acetaminophen (TYLENOL) tablet 650 mg  650 mg Oral Q6H PRN 2009, MD   650 mg at 09/29/17 0549   Or  . acetaminophen (TYLENOL) suppository 650 mg  650 mg Rectal Q6H PRN 10/01/17, MD      . acetaminophen (TYLENOL) tablet 1,000 mg  1,000 mg Oral TID Myrene Galas, MD   1,000 mg at 09/29/17 2142  . amLODipine (NORVASC) tablet 5 mg  5 mg Oral Daily 2143, MD   5 mg at 09/30/17 1114  . ARIPiprazole (ABILIFY) tablet 2.5 mg  2.5 mg Oral Daily 10/02/17, MD   2.5 mg at 09/30/17 1114  . aspirin chewable tablet 81 mg  81 mg Oral Daily Emokpae, Courage, MD   81 mg at 09/30/17 1113  . atorvastatin (LIPITOR) tablet 20 mg  20 mg Oral QHS 10/02/17, MD   20 mg at 09/29/17 2141  . carbamazepine (TEGRETOL) chewable tablet 100 mg  100 mg  Oral BID Myrene Galas, MD   100 mg at 09/30/17 1114  . Chlorhexidine Gluconate Cloth 2 % PADS 6 each  6 each Topical Q0600 Myrene Galas, MD   6 each at 09/30/17 0541  . dicyclomine (BENTYL) capsule 10 mg  10 mg Oral TID AC & HS Myrene Galas, MD   10 mg at 09/30/17 1113  . diphenhydrAMINE (BENADRYL) tablet 25 mg  25 mg Oral Q4H PRN Myrene Galas, MD      . docusate sodium (COLACE) capsule 100 mg  100 mg Oral BID Myrene Galas, MD   100 mg at 09/30/17 1113  . enoxaparin (LOVENOX) injection 40 mg  40 mg Subcutaneous Q24H Myrene Galas, MD   40 mg at 09/30/17 1115   . escitalopram (LEXAPRO) tablet 10 mg  10 mg Oral Daily Myrene Galas, MD   10 mg at 09/30/17 1112  . guaifenesin (ROBITUSSIN) 100 MG/5ML syrup 200 mg  200 mg Oral QID PRN Myrene Galas, MD      . HYDROcodone-acetaminophen (NORCO) 7.5-325 MG per tablet 1-2 tablet  1-2 tablet Oral Q4H PRN Myrene Galas, MD   1 tablet at 09/30/17 1125  . HYDROcodone-acetaminophen (NORCO/VICODIN) 5-325 MG per tablet 1-2 tablet  1-2 tablet Oral Q4H PRN Myrene Galas, MD      . latanoprost (XALATAN) 0.005 % ophthalmic solution 1 drop  1 drop Both Eyes QHS Myrene Galas, MD   1 drop at 09/29/17 2152  . levothyroxine (SYNTHROID, LEVOTHROID) tablet 25 mcg  25 mcg Oral QAC breakfast Myrene Galas, MD   25 mcg at 09/30/17 1112  . loperamide (IMODIUM) capsule 2 mg  2 mg Oral PRN Myrene Galas, MD      . LORazepam (ATIVAN) tablet 0.25 mg  0.25 mg Oral Daily PRN Myrene Galas, MD      . Melatonin TABS 3 mg  3 mg Oral Kathyrn Drown, MD   3 mg at 09/29/17 2210  . metoCLOPramide (REGLAN) tablet 5-10 mg  5-10 mg Oral Q8H PRN Myrene Galas, MD       Or  . metoCLOPramide (REGLAN) injection 5-10 mg  5-10 mg Intravenous Q8H PRN Myrene Galas, MD      . mirtazapine (REMERON) tablet 7.5 mg  7.5 mg Oral Kathyrn Drown, MD   7.5 mg at 09/29/17 2142  . morphine 2 MG/ML injection 0.5-1 mg  0.5-1 mg Intravenous Q2H PRN Myrene Galas, MD      . mupirocin ointment (BACTROBAN) 2 % 1 application  1 application Nasal BID Myrene Galas, MD   1 application at 09/30/17 1115  . ondansetron (ZOFRAN) tablet 4 mg  4 mg Oral Q6H PRN Myrene Galas, MD       Or  . ondansetron [facility]) injection 4 mg  4 mg Intravenous Q6H PRN Myrene Galas, MD      . pantoprazole (PROTONIX) EC tablet 40 mg  40 mg Oral Daily Myrene Galas, MD   40 mg at 09/30/17 1113  . polyethylene glycol (MIRALAX / GLYCOLAX) packet 17 g  17 g Oral Daily Myrene Galas, MD   17 g at 09/30/17 1115  . polyvinyl alcohol (LIQUIFILM TEARS) 1.4 % ophthalmic solution 1  drop  1 drop Both Eyes Daily PRN Myrene Galas, MD      . simethicone [facility]) chewable tablet 80 mg  80 mg Oral TID PRN Myrene Galas, MD         Discharge Medications: Please see discharge summary for a list of discharge medications.  Relevant Imaging Results:  Relevant Lab Results:   Additional Information SS#: [id number]  Tresa Moore, LCSW

## 2017-10-12 ENCOUNTER — Encounter (HOSPITAL_COMMUNITY): Payer: Self-pay | Admitting: Orthopedic Surgery

## 2019-01-14 ENCOUNTER — Encounter (HOSPITAL_COMMUNITY): Payer: Self-pay | Admitting: Internal Medicine

## 2019-01-14 ENCOUNTER — Observation Stay (HOSPITAL_COMMUNITY)
Admission: AD | Admit: 2019-01-14 | Discharge: 2019-01-16 | Disposition: A | Discharge status: Home / Self Care | Payer: Medicare Other | Source: Other Acute Inpatient Hospital | Attending: Internal Medicine | Admitting: Internal Medicine

## 2019-01-14 DIAGNOSIS — F0390 Unspecified dementia without behavioral disturbance: Secondary | ICD-10-CM | POA: Insufficient documentation

## 2019-01-14 DIAGNOSIS — E785 Hyperlipidemia, unspecified: Secondary | ICD-10-CM | POA: Insufficient documentation

## 2019-01-14 DIAGNOSIS — Z86711 Personal history of pulmonary embolism: Secondary | ICD-10-CM | POA: Diagnosis not present

## 2019-01-14 DIAGNOSIS — Z66 Do not resuscitate: Secondary | ICD-10-CM | POA: Diagnosis not present

## 2019-01-14 DIAGNOSIS — K5730 Diverticulosis of large intestine without perforation or abscess without bleeding: Secondary | ICD-10-CM | POA: Diagnosis not present

## 2019-01-14 DIAGNOSIS — I10 Essential (primary) hypertension: Secondary | ICD-10-CM | POA: Insufficient documentation

## 2019-01-14 DIAGNOSIS — Z79899 Other long term (current) drug therapy: Secondary | ICD-10-CM | POA: Diagnosis not present

## 2019-01-14 DIAGNOSIS — H409 Unspecified glaucoma: Secondary | ICD-10-CM | POA: Insufficient documentation

## 2019-01-14 DIAGNOSIS — H3530 Unspecified macular degeneration: Secondary | ICD-10-CM | POA: Diagnosis not present

## 2019-01-14 DIAGNOSIS — R3129 Other microscopic hematuria: Secondary | ICD-10-CM | POA: Insufficient documentation

## 2019-01-14 DIAGNOSIS — E039 Hypothyroidism, unspecified: Secondary | ICD-10-CM | POA: Diagnosis present

## 2019-01-14 DIAGNOSIS — Z1159 Encounter for screening for other viral diseases: Secondary | ICD-10-CM | POA: Insufficient documentation

## 2019-01-14 DIAGNOSIS — K219 Gastro-esophageal reflux disease without esophagitis: Secondary | ICD-10-CM | POA: Diagnosis present

## 2019-01-14 DIAGNOSIS — Z950 Presence of cardiac pacemaker: Secondary | ICD-10-CM | POA: Diagnosis not present

## 2019-01-14 DIAGNOSIS — J8410 Pulmonary fibrosis, unspecified: Secondary | ICD-10-CM | POA: Diagnosis not present

## 2019-01-14 DIAGNOSIS — M1990 Unspecified osteoarthritis, unspecified site: Secondary | ICD-10-CM | POA: Diagnosis not present

## 2019-01-14 DIAGNOSIS — D509 Iron deficiency anemia, unspecified: Secondary | ICD-10-CM | POA: Diagnosis present

## 2019-01-14 DIAGNOSIS — W19XXXA Unspecified fall, initial encounter: Secondary | ICD-10-CM | POA: Insufficient documentation

## 2019-01-14 DIAGNOSIS — I495 Sick sinus syndrome: Secondary | ICD-10-CM | POA: Insufficient documentation

## 2019-01-14 DIAGNOSIS — Z8249 Family history of ischemic heart disease and other diseases of the circulatory system: Secondary | ICD-10-CM | POA: Insufficient documentation

## 2019-01-14 DIAGNOSIS — Z79890 Hormone replacement therapy (postmenopausal): Secondary | ICD-10-CM | POA: Insufficient documentation

## 2019-01-14 DIAGNOSIS — H547 Unspecified visual loss: Secondary | ICD-10-CM | POA: Diagnosis not present

## 2019-01-14 DIAGNOSIS — G43909 Migraine, unspecified, not intractable, without status migrainosus: Secondary | ICD-10-CM | POA: Diagnosis not present

## 2019-01-14 DIAGNOSIS — Z7982 Long term (current) use of aspirin: Secondary | ICD-10-CM | POA: Insufficient documentation

## 2019-01-14 DIAGNOSIS — I2510 Atherosclerotic heart disease of native coronary artery without angina pectoris: Secondary | ICD-10-CM | POA: Diagnosis not present

## 2019-01-14 DIAGNOSIS — Z885 Allergy status to narcotic agent status: Secondary | ICD-10-CM | POA: Insufficient documentation

## 2019-01-14 DIAGNOSIS — F329 Major depressive disorder, single episode, unspecified: Secondary | ICD-10-CM | POA: Insufficient documentation

## 2019-01-14 DIAGNOSIS — D649 Anemia, unspecified: Secondary | ICD-10-CM | POA: Diagnosis present

## 2019-01-14 DIAGNOSIS — G8929 Other chronic pain: Secondary | ICD-10-CM | POA: Insufficient documentation

## 2019-01-14 DIAGNOSIS — K5909 Other constipation: Secondary | ICD-10-CM | POA: Insufficient documentation

## 2019-01-14 DIAGNOSIS — Z7901 Long term (current) use of anticoagulants: Secondary | ICD-10-CM | POA: Insufficient documentation

## 2019-01-14 HISTORY — DX: Sick sinus syndrome: I49.5

## 2019-01-14 HISTORY — DX: Essential (primary) hypertension: I10

## 2019-01-14 HISTORY — DX: Anemia, unspecified: D64.9

## 2019-01-14 HISTORY — DX: Hyperlipidemia, unspecified: E78.5

## 2019-01-14 HISTORY — DX: Unspecified macular degeneration: H35.30

## 2019-01-14 HISTORY — DX: Other pulmonary embolism without acute cor pulmonale: I26.99

## 2019-01-14 HISTORY — DX: Rheumatoid arthritis, unspecified: M06.9

## 2019-01-14 HISTORY — DX: Atherosclerotic heart disease of native coronary artery without angina pectoris: I25.10

## 2019-01-14 HISTORY — DX: Pulmonary fibrosis, unspecified: J84.10

## 2019-01-14 HISTORY — DX: Unspecified dementia, unspecified severity, without behavioral disturbance, psychotic disturbance, mood disturbance, and anxiety: F03.90

## 2019-01-14 MED ORDER — SODIUM CHLORIDE 0.9% FLUSH
3.0000 mL | Freq: Two times a day (BID) | INTRAVENOUS | Status: DC
  Administered 2019-01-15 – 2019-01-16 (×2): 3 mL via INTRAVENOUS

## 2019-01-14 MED ORDER — ACETAMINOPHEN 325 MG PO TABS
650.0000 mg | ORAL_TABLET | Freq: Four times a day (QID) | ORAL | Status: DC | PRN
  Administered 2019-01-15: 650 mg via ORAL
  Filled 2019-01-14: qty 2

## 2019-01-14 MED ORDER — ACETAMINOPHEN 650 MG RE SUPP: 650.0000 mg | Freq: Four times a day (QID) | RECTAL | Status: DC | PRN

## 2019-01-14 MED ORDER — SODIUM CHLORIDE 0.9% FLUSH: 3.0000 mL | INTRAVENOUS | Status: DC | PRN

## 2019-01-14 MED ORDER — SODIUM CHLORIDE 0.9 % IV SOLN
INTRAVENOUS | Status: DC
  Administered 2019-01-15: 01:00:00 via INTRAVENOUS

## 2019-01-14 MED ORDER — SODIUM CHLORIDE 0.9 % IV SOLN: 250.0000 mL | INTRAVENOUS | Status: DC | PRN

## 2019-01-15 ENCOUNTER — Encounter (HOSPITAL_COMMUNITY): Payer: Self-pay | Admitting: Internal Medicine

## 2019-01-15 ENCOUNTER — Other Ambulatory Visit: Payer: Self-pay

## 2019-01-15 ENCOUNTER — Observation Stay (HOSPITAL_COMMUNITY): Payer: Medicare Other

## 2019-01-15 DIAGNOSIS — D649 Anemia, unspecified: Secondary | ICD-10-CM

## 2019-01-15 DIAGNOSIS — I2510 Atherosclerotic heart disease of native coronary artery without angina pectoris: Secondary | ICD-10-CM

## 2019-01-15 DIAGNOSIS — E039 Hypothyroidism, unspecified: Secondary | ICD-10-CM

## 2019-01-15 DIAGNOSIS — D509 Iron deficiency anemia, unspecified: Secondary | ICD-10-CM | POA: Diagnosis not present

## 2019-01-15 DIAGNOSIS — K219 Gastro-esophageal reflux disease without esophagitis: Secondary | ICD-10-CM

## 2019-01-15 LAB — COMPREHENSIVE METABOLIC PANEL
ALT: 19 U/L (ref 0–44)
AST: 23 U/L (ref 15–41)
Albumin: 3.2 g/dL — ABNORMAL LOW (ref 3.5–5.0)
Alkaline Phosphatase: 85 U/L (ref 38–126)
Anion gap: 8 (ref 5–15)
BUN: 15 mg/dL (ref 8–23)
CO2: 22 mmol/L (ref 22–32)
Calcium: 8.3 mg/dL — ABNORMAL LOW (ref 8.9–10.3)
Chloride: 108 mmol/L (ref 98–111)
Creatinine, Ser: 0.69 mg/dL (ref 0.44–1.00)
GFR calc Af Amer: >60 mL/min (ref >60)
GFR calc non Af Amer: >60 mL/min (ref >60)
Glucose, Bld: 95 mg/dL (ref 70–99)
Potassium: 4 mmol/L (ref 3.5–5.1)
Sodium: 138 mmol/L (ref 135–145)
Total Bilirubin: 0.4 mg/dL (ref 0.3–1.2)
Total Protein: 6.2 g/dL — ABNORMAL LOW (ref 6.5–8.1)

## 2019-01-15 LAB — PROTIME-INR
INR: 1.2 (ref 0.8–1.2)
Prothrombin Time: 14.7 seconds (ref 11.4–15.2)

## 2019-01-15 LAB — CBC
HCT: 28.7 % — ABNORMAL LOW (ref 36.0–46.0)
HCT: 27 % — ABNORMAL LOW (ref 36.0–46.0)
Hemoglobin: 8.8 g/dL — ABNORMAL LOW (ref 12.0–15.0)
Hemoglobin: 8.3 g/dL — ABNORMAL LOW (ref 12.0–15.0)
MCH: 28.1 pg (ref 26.0–34.0)
MCH: 28.2 pg (ref 26.0–34.0)
MCHC: 30.7 g/dL (ref 30.0–36.0)
MCHC: 30.7 g/dL (ref 30.0–36.0)
MCV: 91.7 fL (ref 80.0–100.0)
MCV: 91.8 fL (ref 80.0–100.0)
Platelets: 244 10*3/uL (ref 150–400)
Platelets: 296 10*3/uL (ref 150–400)
RBC: 3.13 MIL/uL — ABNORMAL LOW (ref 3.87–5.11)
RBC: 2.94 MIL/uL — ABNORMAL LOW (ref 3.87–5.11)
RDW: 13.1 % (ref 11.5–15.5)
RDW: 13 % (ref 11.5–15.5)
WBC: 9.7 10*3/uL (ref 4.0–10.5)
WBC: 7.2 10*3/uL (ref 4.0–10.5)
nRBC: 0 % (ref 0.0–0.2)
nRBC: 0 % (ref 0.0–0.2)

## 2019-01-15 LAB — TYPE AND SCREEN
ABO/RH(D): A POS
Antibody Screen: NEGATIVE

## 2019-01-15 LAB — SEDIMENTATION RATE: Sed Rate: 15 mm/hr (ref 0–22)

## 2019-01-15 LAB — TSH: TSH: 5.714 u[IU]/mL — ABNORMAL HIGH (ref 0.350–4.500)

## 2019-01-15 LAB — IRON AND TIBC
Iron: 18 ug/dL — ABNORMAL LOW (ref 28–170)
Saturation Ratios: 4 % — ABNORMAL LOW (ref 10.4–31.8)
TIBC: 486 ug/dL — ABNORMAL HIGH (ref 250–450)
UIBC: 468 ug/dL

## 2019-01-15 LAB — VITAMIN B12: Vitamin B-12: 821 pg/mL (ref 180–914)

## 2019-01-15 LAB — MRSA PCR SCREENING: MRSA by PCR: NEGATIVE

## 2019-01-15 LAB — FERRITIN: Ferritin: 14 ng/mL (ref 11–307)

## 2019-01-15 LAB — ABO/RH: ABO/RH(D): A POS

## 2019-01-15 MED ORDER — LORAZEPAM 0.5 MG PO TABS: 0.2500 mg | ORAL_TABLET | Freq: Every day | ORAL | Status: DC | PRN

## 2019-01-15 MED ORDER — POLYVINYL ALCOHOL 1.4 % OP SOLN: 1.0000 [drp] | Freq: Every day | OPHTHALMIC | Status: DC | PRN

## 2019-01-15 MED ORDER — LORAZEPAM 0.5 MG PO TABS
0.2500 mg | ORAL_TABLET | Freq: Once | ORAL | Status: AC
  Administered 2019-01-15: 0.25 mg via ORAL
  Filled 2019-01-15: qty 1

## 2019-01-15 MED ORDER — CARBAMAZEPINE 100 MG PO CHEW
100.0000 mg | CHEWABLE_TABLET | Freq: Two times a day (BID) | ORAL | Status: DC
  Administered 2019-01-15 (×3): 100 mg via ORAL
  Filled 2019-01-15 (×5): qty 1

## 2019-01-15 MED ORDER — DIPHENHYDRAMINE HCL 25 MG PO CAPS: 25.0000 mg | ORAL_CAPSULE | ORAL | Status: DC | PRN

## 2019-01-15 MED ORDER — SODIUM CHLORIDE 0.9 % IV SOLN
8.0000 mg/h | INTRAVENOUS | Status: DC
  Administered 2019-01-15: 8 mg/h via INTRAVENOUS
  Filled 2019-01-15: qty 80

## 2019-01-15 MED ORDER — PANTOPRAZOLE SODIUM 40 MG PO TBEC
40.0000 mg | DELAYED_RELEASE_TABLET | Freq: Every day | ORAL | Status: DC
  Administered 2019-01-15: 40 mg via ORAL
  Filled 2019-01-15: qty 1

## 2019-01-15 MED ORDER — ALBUTEROL SULFATE (2.5 MG/3ML) 0.083% IN NEBU: 3.0000 mL | INHALATION_SOLUTION | Freq: Four times a day (QID) | RESPIRATORY_TRACT | Status: DC | PRN

## 2019-01-15 MED ORDER — SENNOSIDES-DOCUSATE SODIUM 8.6-50 MG PO TABS
1.0000 | ORAL_TABLET | Freq: Two times a day (BID) | ORAL | Status: DC
  Administered 2019-01-15 (×2): 1 via ORAL
  Filled 2019-01-15 (×3): qty 1

## 2019-01-15 MED ORDER — LOPERAMIDE HCL 2 MG PO CAPS: 2.0000 mg | ORAL_CAPSULE | ORAL | Status: DC | PRN

## 2019-01-15 MED ORDER — HYDROCODONE-ACETAMINOPHEN 7.5-325 MG PO TABS
1.0000 | ORAL_TABLET | ORAL | Status: DC | PRN
  Administered 2019-01-15: 1 via ORAL
  Filled 2019-01-15: qty 1

## 2019-01-15 MED ORDER — AMLODIPINE BESYLATE 5 MG PO TABS
5.0000 mg | ORAL_TABLET | Freq: Every day | ORAL | Status: DC
  Administered 2019-01-15: 5 mg via ORAL
  Filled 2019-01-15 (×2): qty 1

## 2019-01-15 MED ORDER — DICYCLOMINE HCL 10 MG PO CAPS
10.0000 mg | ORAL_CAPSULE | Freq: Three times a day (TID) | ORAL | Status: DC
  Administered 2019-01-15 (×4): 10 mg via ORAL
  Filled 2019-01-15 (×6): qty 1

## 2019-01-15 MED ORDER — ARIPIPRAZOLE 5 MG PO TABS: 2.5000 mg | ORAL_TABLET | Freq: Every day | ORAL | Status: DC

## 2019-01-15 MED ORDER — BISACODYL 10 MG RE SUPP
10.0000 mg | Freq: Once | RECTAL | Status: AC
  Administered 2019-01-15: 10 mg via RECTAL
  Filled 2019-01-15: qty 1

## 2019-01-15 MED ORDER — HYDRALAZINE HCL 20 MG/ML IJ SOLN
10.0000 mg | Freq: Four times a day (QID) | INTRAMUSCULAR | Status: DC | PRN
  Administered 2019-01-15: 01:00:00 10 mg via INTRAVENOUS
  Filled 2019-01-15: qty 1

## 2019-01-15 MED ORDER — SIMETHICONE 80 MG PO CHEW: 80.0000 mg | CHEWABLE_TABLET | Freq: Three times a day (TID) | ORAL | Status: DC | PRN

## 2019-01-15 MED ORDER — ESCITALOPRAM OXALATE 10 MG PO TABS
10.0000 mg | ORAL_TABLET | Freq: Every day | ORAL | Status: DC
  Administered 2019-01-15: 10 mg via ORAL
  Filled 2019-01-15 (×2): qty 1

## 2019-01-15 MED ORDER — POLYETHYLENE GLYCOL 3350 17 G PO PACK
17.0000 g | PACK | Freq: Every day | ORAL | Status: DC
  Administered 2019-01-15: 17 g via ORAL
  Filled 2019-01-15: qty 1

## 2019-01-15 MED ORDER — POLYETHYLENE GLYCOL 3350 17 G PO PACK
17.0000 g | PACK | Freq: Two times a day (BID) | ORAL | Status: DC
  Administered 2019-01-15 – 2019-01-16 (×2): 17 g via ORAL
  Filled 2019-01-15 (×2): qty 1

## 2019-01-15 MED ORDER — MIRTAZAPINE 15 MG PO TABS
7.5000 mg | ORAL_TABLET | Freq: Every day | ORAL | Status: DC
  Administered 2019-01-15 (×2): 7.5 mg via ORAL
  Filled 2019-01-15 (×2): qty 1

## 2019-01-15 MED ORDER — LEVOTHYROXINE SODIUM 25 MCG PO TABS
25.0000 ug | ORAL_TABLET | Freq: Every day | ORAL | Status: DC
  Administered 2019-01-15: 06:00:00 25 ug via ORAL
  Filled 2019-01-15 (×2): qty 1

## 2019-01-15 MED ORDER — SODIUM CHLORIDE 0.9 % IV SOLN
510.0000 mg | Freq: Once | INTRAVENOUS | Status: AC
  Administered 2019-01-15: 510 mg via INTRAVENOUS
  Filled 2019-01-15: qty 17

## 2019-01-15 MED ORDER — ATORVASTATIN CALCIUM 10 MG PO TABS
20.0000 mg | ORAL_TABLET | Freq: Every day | ORAL | Status: DC
  Administered 2019-01-15 (×2): 20 mg via ORAL
  Filled 2019-01-15 (×2): qty 2

## 2019-01-15 MED ORDER — METOPROLOL TARTRATE 25 MG PO TABS
25.0000 mg | ORAL_TABLET | Freq: Two times a day (BID) | ORAL | Status: DC
  Administered 2019-01-15 (×3): 25 mg via ORAL
  Filled 2019-01-15 (×4): qty 1

## 2019-01-15 MED ORDER — LATANOPROST 0.005 % OP SOLN: 1.0000 [drp] | Freq: Every day | OPHTHALMIC | Status: DC

## 2019-01-15 NOTE — Progress Notes (Signed)
Maria Cameron was admitted to [place] via stretcher with [facility] from [facility].  The patient was transferred from stretcher to bed without incident.  A protonix drip that was started at [place] is currently running.  The patient is alert and oriented x4, states that she is blind.  Bed is in the lowest position, bed alarm activated.  Call bell and telephone are within reach.  Explained to patient how to use call bell and helped her locate the button, patient indicated understanding.  Patient denies any further needs at this time.

## 2019-01-15 NOTE — Progress Notes (Signed)
Patient reported having pain in left lower extremity.  When I came to assess patient, she reported her leg pain at 8/10 and had intermittent twitching.  She told me "I was sure I had a stroke a few days ago."  See neuro assessment in flowsheet.  Paged [facility] provider Georges Mouse, MD, who ordered a head CT.  Gave tylenol 650mg  for leg pain.

## 2019-01-15 NOTE — H&P (Addendum)
TRH H&P    Patient Demographics:    Maria Cameron, is a 85 y.o. female  MRN: [id number]  DOB - 07-Nov-1923  Admit Date - 01/14/2019  Referring MD/NP/PA:  Lacinda Axon PA from [facility] Primary MD for the patient is No primary care provider on file.  Patient coming from:   [facility] ER  Chief complaint- anemia   HPI:    Maria Cameron  is a 85 y.o. female,  w hypothyroidism, dementia, hypertension, hyperlipidemia, CAD, sick sinus syndrome s/p pacer 2015, apparently present with fall from [facility] in [place], [place], and on routine labs found to be more anemic than normal.  Hgb 8.7.   FOBT +.  Pt states that she noticed some blood in toilet but she has blindness from macular degeneration.   No GI available at [facility] and therefore sent to [facility] for admission for worsening anemia.   In ED,  T 98.1, P 60  R 20  Bp 153/66 pox 94%   Wbc 5.9, Hgb 8.7, Plt 304 INR 1.1 Na 137, K 4.3,  Bun 24, Creatinine 0.7 Ast 28, Alt 18, Alk phos 82, T. Bili 0.3 Trop <0.01  Urinalysis rbc 2-5  Wbc 0-2  Covid 19  Negative   CXR-> no active disease  CT pelvis:  No acute fracture or dislocation in the pelvis  EKG  Paced at 60   Pt transferred to [facility] for admission for anemia, + FOBT       Review of systems:    In addition to the HPI above,  Unable to obtain reliably due to dementia  No Fever-chills, No Headache, No changes with Vision or hearing, No problems swallowing food or Liquids, No Chest pain, Cough or Shortness of Breath, No Abdominal pain, No Nausea or Vomiting, bowel movements are regular, No Blood in Urine, No dysuria, No new skin rashes or bruises, No new joints pains-aches,  No new weakness, tingling, numbness in any extremity, No recent weight gain or loss, No polyuria, polydypsia or polyphagia, No significant Mental Stressors.  All other systems reviewed and are negative.     Past History of the following :    Past Medical History:  Diagnosis Date  . Anemia   . CAD (coronary artery disease)   . Dementia ([place])   . Hyperlipidemia   . Hypertension   . Hypothyroidism   . Macular degeneration   . Pulmonary fibrosis ([place])   . Sick sinus syndrome [facility])       Past Surgical History:  Procedure Laterality Date  . ORIF FEMUR FRACTURE Right 09/28/2017   Procedure: OPEN REDUCTION INTERNAL FIXATION FEMORAL SHAFT FRACTURE;  Surgeon: Altamese Spiceland, MD;  Location: [facility];  Service: Orthopedics;  Laterality: Right;  . PACEMAKER PLACEMENT  03/14/2014   DDD PPM - [employer]        Social History:      Social History   Tobacco Use  . Smoking status: Never Smoker  . Smokeless tobacco: Never Used  Substance Use Topics  . Alcohol use: Not Currently  Family History :     Family History  Problem Relation Age of Onset  . CAD Father   . CAD Brother        Home Medications:   Prior to Admission medications   Medication Sig Start Date End Date Taking? Authorizing Provider  acetaminophen (TYLENOL) 325 MG tablet Take 650 mg by mouth every 6 (six) hours as needed for mild pain or fever.    [provider]  albuterol (PROVENTIL HFA;VENTOLIN HFA) 108 (90 Base) MCG/ACT inhaler Inhale 2 puffs into the lungs every 6 (six) hours as needed for wheezing or shortness of breath.    [provider]  amLODipine (NORVASC) 5 MG tablet Take 5 mg by mouth daily.    [provider]  ARIPiprazole (ABILIFY) 5 MG tablet Take 2.5 mg by mouth daily.    [provider]  aspirin 81 MG chewable tablet Chew 1 tablet (81 mg total) by mouth daily. With food 10/01/17   Roxan Hockey, MD  atorvastatin (LIPITOR) 20 MG tablet Take 20 mg by mouth at bedtime.    [provider]  bimatoprost (LUMIGAN) 0.01 % SOLN Place 1 drop into both eyes at bedtime.    [provider]  carbamazepine (TEGRETOL) 100 MG chewable tablet Chew 100 mg by  mouth 2 (two) times daily.    [provider]  Carboxymethylcellulose Sod PF (REFRESH PLUS) 0.5 % SOLN Place 1 drop into both eyes daily as needed (for eye irritation).    [provider]  dicyclomine (BENTYL) 10 MG capsule Take 10 mg by mouth 4 (four) times daily -  before meals and at bedtime.    [provider]  diphenhydrAMINE (BENADRYL) 25 MG tablet Take 25 mg by mouth every 4 (four) hours as needed for allergies.    [provider]  enoxaparin (LOVENOX) 40 MG/0.4ML injection Inject 0.4 mLs (40 mg total) into the skin daily. For 30 days, then PCP to [facility] 10/01/17   Roxan Hockey, MD  escitalopram (LEXAPRO) 10 MG tablet Take 10 mg by mouth daily.    [provider]  guaifenesin (ROBITUSSIN) 100 MG/5ML syrup Take 200 mg by mouth 4 (four) times daily as needed for cough.    [provider]  HYDROcodone-acetaminophen (NORCO) 7.5-325 MG tablet Take 1 tablet by mouth every 4 (four) hours as needed for moderate pain or severe pain (pain score 7-10). 09/30/17   Roxan Hockey, MD  levothyroxine (SYNTHROID, LEVOTHROID) 25 MCG tablet Take 25 mcg by mouth daily before breakfast.    [provider]  loperamide (IMODIUM) 2 MG capsule Take 2 mg by mouth as needed for diarrhea or loose stools.    [provider]  LORazepam (ATIVAN) 0.5 MG tablet Take 0.5 tablets (0.25 mg total) by mouth daily as needed for anxiety. 09/30/17   Roxan Hockey, MD  Melatonin 3 MG TABS Take 3 mg by mouth at bedtime.    [provider]  metoprolol tartrate (LOPRESSOR) 25 MG tablet Take 1 tablet (25 mg total) by mouth 2 (two) times daily. 09/30/17 09/30/18  Roxan Hockey, MD  mirtazapine (REMERON) 7.5 MG tablet Take 7.5 mg by mouth at bedtime.    [provider]  pantoprazole (PROTONIX) 40 MG tablet Take 40 mg by mouth daily.    [provider]  polyethylene glycol (MIRALAX / GLYCOLAX) packet Take 17 g by mouth daily.     [provider]  simethicone (MYLICON) 80 MG chewable tablet Chew 80 mg by mouth 3 (  three) times daily as needed for flatulence.    [provider]     Allergies:     Allergies  Allergen Reactions  . Codeine Other (See Comments)    Pt reports allergy but not documented     Physical Exam:   Vitals  Blood pressure (!) 183/70, pulse 62, height '5\' 5"'$  (1.651 m), weight 65.8 kg, SpO2 98 %.  1.  General: axox1 (person)  2. Psychiatric: euthymic  3. Neurologic: cn2-12 intact, reflexes 2+ symmetric, diffuse with no clonus, motor 5/5 in all 4 ext  4. HEENMT:  Anicteric, pupils 1.3m symmetric, direct, consensual, near intact Neck: no jvd,    5. Respiratory : CTAB  6. Cardiovascular : rrr s1, s2, no m/g/r  7. Gastrointestinal:  Abd: soft, nt, nd, +bs  8. Skin:  Ext: no c/c/e   , no rash  9.Musculoskeletal:  Good ROM  No adenoapthy    Data Review:    CBC No results for input(s): WBC, HGB, HCT, PLT, MCV, MCH, MCHC, RDW, LYMPHSABS, MONOABS, EOSABS, BASOSABS, BANDABS in the last 168 hours.  Invalid input(s): NEUTRABS, BANDSABD ------------------------------------------------------------------------------------------------------------------  No results found for this or any previous visit (from the past 464hour(s)).  Chemistries  No results for input(s): NA, K, CL, CO2, GLUCOSE, BUN, CREATININE, CALCIUM, MG, AST, ALT, ALKPHOS, BILITOT in the last 168 hours.  Invalid input(s): GFRCGP ------------------------------------------------------------------------------------------------------------------  ------------------------------------------------------------------------------------------------------------------ GFR: CrCl cannot be calculated (Patient's most recent lab result is older than the maximum 21 days allowed.). Liver Function Tests: No results for input(s): AST, ALT, ALKPHOS, BILITOT, PROT, ALBUMIN in the last 168 hours. No results for  input(s): LIPASE, AMYLASE in the last 168 hours. No results for input(s): AMMONIA in the last 168 hours. Coagulation Profile: No results for input(s): INR, PROTIME in the last 168 hours. Cardiac Enzymes: No results for input(s): CKTOTAL, CKMB, CKMBINDEX, TROPONINI in the last 168 hours. BNP (last 3 results) No results for input(s): PROBNP in the last 8760 hours. HbA1C: No results for input(s): HGBA1C in the last 72 hours. CBG: No results for input(s): GLUCAP in the last 168 hours. Lipid Profile: No results for input(s): CHOL, HDL, LDLCALC, TRIG, CHOLHDL, LDLDIRECT in the last 72 hours. Thyroid Function Tests: No results for input(s): TSH, T4TOTAL, FREET4, T3FREE, THYROIDAB in the last 72 hours. Anemia Panel: No results for input(s): VITAMINB12, FOLATE, FERRITIN, TIBC, IRON, RETICCTPCT in the last 72 hours.  --------------------------------------------------------------------------------------------------------------- Urine analysis: No results found for: COLORURINE, APPEARANCEUR, LABSPEC, PHURINE, GLUCOSEU, HGBUR, BILIRUBINUR, KETONESUR, PROTEINUR, UROBILINOGEN, NITRITE, LEUKOCYTESUR    Imaging Results:    No results found.     Assessment & Plan:    Principal Problem:   Anemia Active Problems:   CAD (coronary artery disease)   Hypothyroid   GERD (gastroesophageal reflux disease)   Symptomatic anemia  Anemia NPO Check cbc now and at 7am  +FOBT, and c/o blood in toilet  ? GI bleeding Type and screen Serial cbc as above Pt initially received protonix '80mg'$  iv x1 in ED, and then '8mg'$ /hr ? Do not suspect UGI bleeding, but will continue for now Please consult GI in AM  H/o PE Hold Xarelto , typically takes at 5Odemwhether can restart in AM   Microscopic hematuria Check CT scan abd/ pelvis  Hypothyroidism Cont Levothyroxine 25 micrograms po qday  CAD  Hold Aspirin Cont metoprolol '25mg'$  po bid  Hypertension Cont Amlodipine '5mg'$  poq day  Hyperlipidemia  Cont Lipitor '20mg'$  po qhs  Dementia/ Depression Cont Abilify '2mg'$  po  qday Cont Lexapro 11m po qday Cont Remeron 7.546mpo qhs  Hx of migraines Cont Tegretol 10070mo bid, consider holding if anemia worsening  Macular degeneration/ Glaucoma Cont Lumigan  DVT Prophylaxis-  SCDs   AM Labs Ordered, also please review Full Orders  Family Communication: Admission, patients condition and plan of care including tests being ordered have been discussed with the patient  who indicate understanding and agree with the plan and Code Status.  Code Status:  DNR,  Notified daughter [facility]  Admission status: Observation : Based on patients clinical presentation and evaluation of above clinical data, I have made determination that patient meets observation criteria at this time. Depending upon results of w/up may require change to inpatient admission.   Time spent in minutes : 70   JamJani GravelD on 01/15/2019 at 12:07 AM

## 2019-01-15 NOTE — Progress Notes (Addendum)
PROGRESS NOTE    Maria Cameron  [id number] DOB: 1924-01-21 DOA: 01/14/2019 PCP: No primary care provider on file.  Brief Narrative: This is a 85 year old frail female with history of mild dementia, blindness, CAD, sick sinus syndrome status post pacemaker in 2015, hypothyroidism, hypertension, dyslipidemia, chronic constipation resident of assisted living facility in  was sent to the emergency room with anemia and positive Hemoccult.Pt reports chronic constipation with hard stools, recurrent fecal impaction and sometimes blood around her stools  Assessment & Plan:   Iron deficiency anemia -Mild intermittent lower GI bleed in the background of chronic constipation with hard stools and impaction on a recurrent basis -Patient reports blood around her stools, also reports having to manually disimpact herself -Prior history of colonoscopy, remotely in the past, does not recall timeline, but thinks this was unremarkable -Hemoglobin 8.3, baseline is around 10 -CT on admission noted sigmoid colon diverticuli -Recommended addition of stool softeners, laxatives to her regimen since she is dependent on low-dose narcotics for chronic pain from arthritis diffusely -Will add suppository today -Give IV iron x1 -Due to advanced age, dementia did not recommend endoscopic evaluation at this time unless profuse active bleeding ensues, discussed this with daughter who agrees -History of PE about 3 years ago, xarelto held for now, resume soon   H/o pulmonary embolism  -Daughter this was 2 to 3 years ago, still on xarelto, held for now -resume at DC  Chronic constipation -I suspect this is contributing to bleeding, see discussion above  Mild dementia/depression -Continue Abilify Lexapro and Remeron -Resident of ALF Stable  History of CAD, prior stents -Holding aspirin, continue metoprolol  Hypothyroidism -Continue Synthroid  DVT prophylaxis: SCDs Code Status: DNR Family Communication: No  family at bedside, called and updated daughter Reba Disposition Plan: Back to ALF tomorrow if stable  Consultants:     Procedures:   Antimicrobials:    Subjective: -Complains of constipation, last BM was 2 to 3 days ago -No bleeding overnight -Feels nauseous  Objective: Vitals:   01/15/19 0106 01/15/19 0500 01/15/19 0543 01/15/19 1145  BP: (!) 170/72  107/69 (!) 143/50  Pulse: 64  63 (!) 59  Resp:    16  Temp:    97.6 F (36.4 C)  TempSrc:    Oral  SpO2: 99%  98% 98%  Weight:  70.8 kg    Height:        Intake/Output Summary (Last 24 hours) at 01/15/2019 1206 Last data filed at 01/15/2019 0544 Gross per 24 hour  Intake 267.86 ml  Output 700 ml  Net -432.14 ml   Filed Weights   01/14/19 2337 01/15/19 0500  Weight: 65.8 kg 70.8 kg    Examination:  General exam: Elderly frail chronically ill female, laying in bed, awake alert oriented to self and place, only partly to time Respiratory system: Clear bilaterally Cardiovascular system: S1-S2/systolic murmur  gastrointestinal system: Abdomen is nondistended, soft and nontender.Normal bowel sounds heard. Central nervous system: Moves all extremities, no localizing signs. Extremities: No edema Skin: No rashes, lesions or ulcers Psychiatry:  Mood & affect appropriate.     Data Reviewed:   CBC: Recent Labs  Lab 01/15/19 0022 01/15/19 0817  WBC 9.7 7.2  HGB 8.8* 8.3*  HCT 28.7* 27.0*  MCV 91.7 91.8  PLT 244 296   Basic Metabolic Panel: Recent Labs  Lab 01/15/19 0022  NA 138  K 4.0  CL 108  CO2 22  GLUCOSE 95  BUN 15  CREATININE 0.69  CALCIUM 8.3*  GFR: Estimated Creatinine Clearance: 44.3 mL/min (by C-G formula based on SCr of 0.69 mg/dL). Liver Function Tests: Recent Labs  Lab 01/15/19 0022  AST 23  ALT 19  ALKPHOS 85  BILITOT 0.4  PROT 6.2*  ALBUMIN 3.2*   No results for input(s): LIPASE, AMYLASE in the last 168 hours. No results for input(s): AMMONIA in the last 168  hours. Coagulation Profile: Recent Labs  Lab 01/15/19 0022  INR 1.2   Cardiac Enzymes: No results for input(s): CKTOTAL, CKMB, CKMBINDEX, TROPONINI in the last 168 hours. BNP (last 3 results) No results for input(s): PROBNP in the last 8760 hours. HbA1C: No results for input(s): HGBA1C in the last 72 hours. CBG: No results for input(s): GLUCAP in the last 168 hours. Lipid Profile: No results for input(s): CHOL, HDL, LDLCALC, TRIG, CHOLHDL, LDLDIRECT in the last 72 hours. Thyroid Function Tests: Recent Labs    01/15/19 0022  TSH 5.714*   Anemia Panel: Recent Labs    01/15/19 0022  VITAMINB12 821  FERRITIN 14  TIBC 486*  IRON 18*   Urine analysis: No results found for: COLORURINE, APPEARANCEUR, LABSPEC, PHURINE, GLUCOSEU, HGBUR, BILIRUBINUR, KETONESUR, PROTEINUR, UROBILINOGEN, NITRITE, LEUKOCYTESUR Sepsis Labs: @LABRCNTIP (procalcitonin:4,lacticidven:4)  ) Recent Results (from the past 240 hour(s))  MRSA PCR Screening     Status: None   Collection Time: 01/15/19 12:42 AM   Specimen: Nasal Mucosa; Nasopharyngeal  Result Value Ref Range Status   MRSA by PCR NEGATIVE NEGATIVE Final    Comment:        The GeneXpert MRSA Assay (FDA approved for NASAL specimens only), is one component of a comprehensive MRSA colonization surveillance program. It is not intended to diagnose MRSA infection nor to guide or monitor treatment for MRSA infections. Performed at [facility], [place] [address] [place], [place] [address]          Radiology Studies: Ct Abdomen Pelvis Wo Contrast  Result Date: 01/15/2019 CLINICAL DATA:  Hematuria of unknown cause EXAM: CT ABDOMEN AND PELVIS WITHOUT CONTRAST TECHNIQUE: Multidetector CT imaging of the abdomen and pelvis was performed following the standard protocol without IV contrast. COMPARISON:  CT 09/25/2018, 11/17/2016 FINDINGS: Lower chest: Lung bases demonstrate stable nodularity at the right middle lobe and lingula. Mild  subpleural scarring. No consolidation or effusion. Partially visualized cardiac pacing leads. Coronary vascular calcification. Hepatobiliary: No focal liver abnormality is seen. Status post cholecystectomy. No biliary dilatation. Pancreas: Unremarkable. No pancreatic ductal dilatation or surrounding inflammatory changes. Spleen: Normal in size without focal abnormality. Adrenals/Urinary Tract: Adrenal glands are normal. No hydronephrosis. Cyst mid to upper right kidney. Distended urinary bladder. Limited evaluation of distal ureters secondary to streak artifact from right hip hardware. Stomach/Bowel: Stomach is within normal limits. Sigmoid colon diverticular disease without acute inflammatory process. Moderate feces retention at the rectum. No evidence of bowel wall thickening, distention, or inflammatory changes. Vascular/Lymphatic: Extensive aortic atherosclerosis without aneurysm. No significantly enlarged lymph nodes. Reproductive: Status post hysterectomy. No adnexal masses. Other: Negative for free air or free fluid Musculoskeletal: Status post right hip replacement. No acute osseous abnormality. Degenerative changes of the spine IMPRESSION: 1. No CT evidence for acute intra-abdominal or pelvic abnormality. Evaluation of distal ureters and bladder slightly limited due to artifact from right hip hardware. Negative for hydronephrosis or stone disease 2. Sigmoid colon diverticula without acute inflammatory process 3. Stable nodularity in the right middle lobe and lingula. See prior recommendation from CT 09/25/2018. Electronically Signed   By: Donavan Foil M.D.   On:  01/15/2019 02:41   Ct Head Wo Contrast  Result Date: 01/15/2019 CLINICAL DATA:  Initial evaluation for acute altered mental status. EXAM: CT HEAD WITHOUT CONTRAST TECHNIQUE: Contiguous axial images were obtained from the base of the skull through the vertex without intravenous contrast. COMPARISON:  Prior CT from 09/25/2018. FINDINGS: Brain:  Age-related cerebral atrophy with chronic microvascular ischemic disease, stable. No acute intracranial hemorrhage. No acute large vessel territory infarct. No mass lesion, midline shift or mass effect. Diffuse ventricular prominence related global parenchymal volume loss without hydrocephalus, stable. No extra-axial fluid collection. Vascular: No hyperdense vessel. Scattered vascular calcifications noted within the carotid siphons. Skull: Scalp soft tissues and calvarium within normal limits. Sinuses/Orbits: Globes and orbital soft tissues within normal limits. Patient status post ocular lens replacement on the right. Hypoplastic and opacified right maxillary sinus, compatible silent sinus syndrome. Paranasal sinuses are otherwise clear. No mastoid effusion. Other: None. IMPRESSION: 1. No acute intracranial abnormality. 2. Age-related cerebral atrophy with mild chronic microvascular ischemic disease, stable. 3. Right maxillary sinusitis. Electronically Signed   By: Rise Mu M.D.   On: 01/15/2019 02:31        Scheduled Meds:  amLODipine  5 mg Oral Daily   atorvastatin  20 mg Oral QHS   carbamazepine  100 mg Oral BID   dicyclomine  10 mg Oral TID AC & HS   escitalopram  10 mg Oral Daily   levothyroxine  25 mcg Oral Q0600   metoprolol tartrate  25 mg Oral BID   mirtazapine  7.5 mg Oral QHS   pantoprazole  40 mg Oral Q1200   polyethylene glycol  17 g Oral Daily   senna-docusate  1 tablet Oral BID   sodium chloride flush  3 mL Intravenous Q12H   Continuous Infusions:  sodium chloride       LOS: 1 day    Time spent:26min   Zannie Cove, MD [facility] Page via [email or web address], password [email or web address] After 7PM please contact night-coverage  01/15/2019, 12:06 PM

## 2019-01-15 NOTE — Progress Notes (Signed)
Spoke with pt's daughter with updates. Went over what meds pt had been given today and what they were for because pt has began to experience some confusion since around 1400. She had wondered if the medications could have caused her confusion. I asked her if pt had experienced confusion before and she stated that it hasnt happened in a while. She also stated that she would not be able to come up here because of her being at risk. Pt is speaking on the phone with her now. I told her to call anytime she had other questions.

## 2019-01-15 NOTE — Plan of Care (Signed)

## 2019-01-15 NOTE — Progress Notes (Signed)
The patient's daughter Treasa [employer] wants an update with the am rounding doctor.  Please call [phone] (home) and [phone] (cell).

## 2019-01-16 DIAGNOSIS — D509 Iron deficiency anemia, unspecified: Secondary | ICD-10-CM | POA: Diagnosis not present

## 2019-01-16 DIAGNOSIS — D649 Anemia, unspecified: Secondary | ICD-10-CM | POA: Diagnosis not present

## 2019-01-16 DIAGNOSIS — K219 Gastro-esophageal reflux disease without esophagitis: Secondary | ICD-10-CM | POA: Diagnosis not present

## 2019-01-16 DIAGNOSIS — E039 Hypothyroidism, unspecified: Secondary | ICD-10-CM | POA: Diagnosis not present

## 2019-01-16 LAB — CBC
HCT: 27.1 % — ABNORMAL LOW (ref 36.0–46.0)
Hemoglobin: 8.4 g/dL — ABNORMAL LOW (ref 12.0–15.0)
MCH: 28.4 pg (ref 26.0–34.0)
MCHC: 31 g/dL (ref 30.0–36.0)
MCV: 91.6 fL (ref 80.0–100.0)
Platelets: 335 10*3/uL (ref 150–400)
RBC: 2.96 MIL/uL — ABNORMAL LOW (ref 3.87–5.11)
RDW: 13 % (ref 11.5–15.5)
WBC: 6.1 10*3/uL (ref 4.0–10.5)
nRBC: 0 % (ref 0.0–0.2)

## 2019-01-16 LAB — BASIC METABOLIC PANEL
Anion gap: 10 (ref 5–15)
BUN: 13 mg/dL (ref 8–23)
CO2: 22 mmol/L (ref 22–32)
Calcium: 8.9 mg/dL (ref 8.9–10.3)
Chloride: 107 mmol/L (ref 98–111)
Creatinine, Ser: 0.69 mg/dL (ref 0.44–1.00)
GFR calc Af Amer: >60 mL/min (ref >60)
GFR calc non Af Amer: >60 mL/min (ref >60)
Glucose, Bld: 107 mg/dL — ABNORMAL HIGH (ref 70–99)
Potassium: 3.4 mmol/L — ABNORMAL LOW (ref 3.5–5.1)
Sodium: 139 mmol/L (ref 135–145)

## 2019-01-16 LAB — FOLATE RBC
Folate, Hemolysate: 388 ng/mL
Folate, RBC: 1311 ng/mL (ref >498)
Hematocrit: 29.6 % — ABNORMAL LOW (ref 34.0–46.6)

## 2019-01-16 MED ORDER — SENNOSIDES-DOCUSATE SODIUM 8.6-50 MG PO TABS: 1.0000 | ORAL_TABLET | Freq: Two times a day (BID) | ORAL | 0 refills | Status: DC

## 2019-01-16 MED ORDER — BISACODYL 10 MG RE SUPP: 10.0000 mg | Freq: Every day | RECTAL | 0 refills | Status: DC | PRN

## 2019-01-16 MED ORDER — XARELTO 20 MG PO TABS: 20.0000 mg | ORAL_TABLET | Freq: Every day | ORAL | Status: DC

## 2019-01-16 MED ORDER — POLYETHYLENE GLYCOL 3350 17 G PO PACK: 17.0000 g | PACK | Freq: Every day | ORAL | 0 refills | Status: DC

## 2019-01-16 NOTE — Discharge Summary (Signed)
Physician Discharge Summary  Maria Cameron YWV:[id number] DOB: 07-09-1923 DOA: 01/14/2019  PCP: No primary care provider on file.  Admit date: 01/14/2019 Discharge date: 01/16/2019  Time spent: 35 minutes  Recommendations for Outpatient Follow-up:  PCP in 1 week, please check CBC at follow-up, resume Xarelto if hemoglobin is stable, consider starting oral iron at follow-up and titrate laxatives for constipation  Discharge Diagnoses:  Iron deficiency anemia Dementia Chronic constipation History of pulmonary embolism 2 to 3 years ago   CAD (coronary artery disease)   Hypothyroid   GERD (gastroesophageal reflux disease)   Symptomatic anemia   Discharge Condition: Stable  Diet recommendation: Heart healthy  Filed Weights   01/14/19 2337 01/15/19 0500  Weight: 65.8 kg 70.8 kg    History of present illness:  85 year old frail female with history of mild dementia, blindness, CAD, sick sinus syndrome status post pacemaker in 2015, hypothyroidism, hypertension, dyslipidemia, chronic constipation resident of assisted living facility in [place] was sent to the emergency room with anemia and positive Hemoccult.Pt reports chronic constipation with hard stools, recurrent fecal impaction and sometimes blood around her stools   Hospital Course:   Iron deficiency anemia -Mild intermittent lower GI bleed in the background of chronic constipation with hard stools and impaction on a recurrent basis -Patient reports blood around her stools, also reports having to manually disimpact herself, I suspect this is the cause of her intermittent bleeding in the background of anticoagulation with Xarelto -Prior history of colonoscopy, remotely in the past, does not recall timeline, but thinks this was unremarkable -Hemoglobin 8.3, baseline is around 10 -CT on admission noted sigmoid colon diverticuli -Recommended addition of stool softeners, laxatives to her regimen  -Given IV iron x1 -Due to advanced  age, dementia did not recommend endoscopic evaluation at this time unless profuse active bleeding ensues, discussed this with daughter who agrees -Hemoglobin remained stable, patient had a large bowel movement after suppository and stool softener/laxative combination, discharged back to ALF in a stable condition -Advised to restart Xarelto in 1 week, recheck CBC at follow-up and consider starting oral iron therapy, may need to increase laxative dose after iron commenced  H/o pulmonary embolism  -Daughter this was 2 to 3 years ago, still on xarelto, held for now -resume in 1 week  Chronic constipation -I suspect this is contributing to bleeding, see discussion above  Mild dementia/depression -Continue Abilify Lexapro and Remeron -Resident of ALF -Did have some increased paranoia overnight, suspicion of staff, suspicion regarding medications -Discharged back to ALF  History of CAD, prior stents -Holding aspirin, continue metoprolol  Hypothyroidism -Continue Synthroid  Discharge Exam: Vitals:   01/15/19 0543 01/15/19 1145  BP: 107/69 (!) 143/50  Pulse: 63 (!) 59  Resp:  16  Temp:  97.6 F (36.4 C)  SpO2: 98% 98%    General: Alert awake oriented to self and partly to place, paranoid Cardiovascular: S1-S2/regular rate rhythm Respiratory: Clear bilaterally  Discharge Instructions    Allergies as of 01/16/2019      Reactions   Codeine Other (See Comments)   Pt reports allergy but not documented   Coffee Flavor    Other reaction(s): Dizziness (intolerance) Pt says it makes her feel "sick" but can't elaborate   Green Tea Leaf Ext    Other reaction(s): Dizziness (intolerance)   Chocolate Flavor Other (See Comments)   headache   Lemon Flavor    Lemon Oil    Other reaction(s): Other (See Comments) Unknown: entered per nursing home records  Medication List    STOP taking these medications   aspirin 81 MG chewable tablet   diclofenac 50 MG tablet Commonly  known as: CATAFLAM   diphenhydrAMINE 25 MG tablet Commonly known as: BENADRYL   enoxaparin 40 MG/0.4ML injection Commonly known as: LOVENOX   HYDROcodone-acetaminophen 7.5-325 MG tablet Commonly known as: NORCO   LORazepam 0.5 MG tablet Commonly known as: ATIVAN     TAKE these medications   acetaminophen 325 MG tablet Commonly known as: TYLENOL Take 650 mg by mouth every 6 (six) hours as needed for mild pain or fever.   albuterol 108 (90 Base) MCG/ACT inhaler Commonly known as: VENTOLIN HFA Inhale 2 puffs into the lungs every 6 (six) hours as needed for wheezing or shortness of breath.   alum & mag hydroxide-simeth 200-200-20 MG/5ML suspension Commonly known as: MAALOX/MYLANTA Take 30 mLs by mouth every 6 (six) hours as needed for indigestion or heartburn.   amLODipine 5 MG tablet Commonly known as: NORVASC Take 5 mg by mouth daily.   anti-nausea solution Take 30 mLs by mouth every 15 (fifteen) minutes as needed for nausea or vomiting.   ARIPiprazole 2 MG tablet Commonly known as: ABILIFY Take 2 mg by mouth daily.   atorvastatin 20 MG tablet Commonly known as: LIPITOR Take 20 mg by mouth at bedtime.   benzonatate 100 MG capsule Commonly known as: TESSALON Take 100 mg by mouth 3 (three) times daily as needed for cough.   bisacodyl 10 MG suppository Commonly known as: Dulcolax Place 1 suppository (10 mg total) rectally daily as needed for moderate constipation.   carbamazepine 100 MG chewable tablet Commonly known as: TEGRETOL Chew 100 mg by mouth 2 (two) times daily.   dicyclomine 10 MG capsule Commonly known as: BENTYL Take 10 mg by mouth 2 times daily at 12 noon and 4 pm.   escitalopram 10 MG tablet Commonly known as: LEXAPRO Take 10 mg by mouth daily.   gabapentin 100 MG capsule Commonly known as: NEURONTIN Take 100 mg by mouth 3 (three) times daily.   guaifenesin 100 MG/5ML syrup Commonly known as: ROBITUSSIN Take 200 mg by mouth 4 (four) times  daily as needed for cough.   latanoprost 0.005 % ophthalmic solution Commonly known as: XALATAN Place 1 drop into both eyes at bedtime.   levothyroxine 25 MCG tablet Commonly known as: SYNTHROID Take 25 mcg by mouth daily before breakfast.   loperamide 2 MG capsule Commonly known as: IMODIUM Take 2 mg by mouth as needed for diarrhea or loose stools.   loratadine 10 MG tablet Commonly known as: CLARITIN Take 10 mg by mouth daily.   magnesium hydroxide 400 MG/5ML suspension Commonly known as: MILK OF MAGNESIA Take 30 mLs by mouth daily as needed for mild constipation.   metoprolol tartrate 25 MG tablet Commonly known as: LOPRESSOR Take 1 tablet (25 mg total) by mouth 2 (two) times daily.   mirtazapine 15 MG tablet Commonly known as: REMERON Take 15 mg by mouth at bedtime.   ondansetron 4 MG tablet Commonly known as: ZOFRAN Take 4 mg by mouth every 6 (six) hours as needed for nausea.   pantoprazole 40 MG tablet Commonly known as: PROTONIX Take 40 mg by mouth daily.   polyethylene glycol 17 g packet Commonly known as: MIRALAX / GLYCOLAX Take 17 g by mouth daily. What changed: when to take this   Refresh Plus 0.5 % Soln Generic drug: Carboxymethylcellulose Sod PF Place 1 drop into both eyes daily as needed (  for eye irritation).   senna-docusate 8.6-50 MG tablet Commonly known as: Senokot-S Take 1 tablet by mouth 2 (two) times daily.   Xarelto 20 MG Tabs tablet Generic drug: rivaroxaban Take 1 tablet (20 mg total) by mouth at bedtime. Hold for 1 week What changed: additional instructions      Allergies  Allergen Reactions  . Codeine Other (See Comments)    Pt reports allergy but not documented  . Coffee Flavor     Other reaction(s): Dizziness (intolerance) Pt says it makes her feel "sick" but can't elaborate  . Green Tea Leaf Ext     Other reaction(s): Dizziness (intolerance)  . Chocolate Flavor Other (See Comments)    headache  . Lemon Flavor   . Lemon  Oil     Other reaction(s): Other (See Comments) Unknown: entered per nursing home records   Follow-up Information    PCP. Schedule an appointment as soon as possible for a visit in 1 week(s).            The results of significant diagnostics from this hospitalization (including imaging, microbiology, ancillary and laboratory) are listed below for reference.    Significant Diagnostic Studies: Ct Abdomen Pelvis Wo Contrast  Result Date: 01/15/2019 CLINICAL DATA:  Hematuria of unknown cause EXAM: CT ABDOMEN AND PELVIS WITHOUT CONTRAST TECHNIQUE: Multidetector CT imaging of the abdomen and pelvis was performed following the standard protocol without IV contrast. COMPARISON:  CT 09/25/2018, 11/17/2016 FINDINGS: Lower chest: Lung bases demonstrate stable nodularity at the right middle lobe and lingula. Mild subpleural scarring. No consolidation or effusion. Partially visualized cardiac pacing leads. Coronary vascular calcification. Hepatobiliary: No focal liver abnormality is seen. Status post cholecystectomy. No biliary dilatation. Pancreas: Unremarkable. No pancreatic ductal dilatation or surrounding inflammatory changes. Spleen: Normal in size without focal abnormality. Adrenals/Urinary Tract: Adrenal glands are normal. No hydronephrosis. Cyst mid to upper right kidney. Distended urinary bladder. Limited evaluation of distal ureters secondary to streak artifact from right hip hardware. Stomach/Bowel: Stomach is within normal limits. Sigmoid colon diverticular disease without acute inflammatory process. Moderate feces retention at the rectum. No evidence of bowel wall thickening, distention, or inflammatory changes. Vascular/Lymphatic: Extensive aortic atherosclerosis without aneurysm. No significantly enlarged lymph nodes. Reproductive: Status post hysterectomy. No adnexal masses. Other: Negative for free air or free fluid Musculoskeletal: Status post right hip replacement. No acute osseous  abnormality. Degenerative changes of the spine IMPRESSION: 1. No CT evidence for acute intra-abdominal or pelvic abnormality. Evaluation of distal ureters and bladder slightly limited due to artifact from right hip hardware. Negative for hydronephrosis or stone disease 2. Sigmoid colon diverticula without acute inflammatory process 3. Stable nodularity in the right middle lobe and lingula. See prior recommendation from CT 09/25/2018. Electronically Signed   By: Jasmine Pang M.D.   On: 01/15/2019 02:41   Ct Head Wo Contrast  Result Date: 01/15/2019 CLINICAL DATA:  Initial evaluation for acute altered mental status. EXAM: CT HEAD WITHOUT CONTRAST TECHNIQUE: Contiguous axial images were obtained from the base of the skull through the vertex without intravenous contrast. COMPARISON:  Prior CT from 09/25/2018. FINDINGS: Brain: Age-related cerebral atrophy with chronic microvascular ischemic disease, stable. No acute intracranial hemorrhage. No acute large vessel territory infarct. No mass lesion, midline shift or mass effect. Diffuse ventricular prominence related global parenchymal volume loss without hydrocephalus, stable. No extra-axial fluid collection. Vascular: No hyperdense vessel. Scattered vascular calcifications noted within the carotid siphons. Skull: Scalp soft tissues and calvarium within normal limits. Sinuses/Orbits: Globes and orbital soft  tissues within normal limits. Patient status post ocular lens replacement on the right. Hypoplastic and opacified right maxillary sinus, compatible silent sinus syndrome. Paranasal sinuses are otherwise clear. No mastoid effusion. Other: None. IMPRESSION: 1. No acute intracranial abnormality. 2. Age-related cerebral atrophy with mild chronic microvascular ischemic disease, stable. 3. Right maxillary sinusitis. Electronically Signed   By: Rise Mu M.D.   On: 01/15/2019 02:31    Microbiology: Recent Results (from the past 240 hour(s))  MRSA PCR  Screening     Status: None   Collection Time: 01/15/19 12:42 AM   Specimen: Nasal Mucosa; Nasopharyngeal  Result Value Ref Range Status   MRSA by PCR NEGATIVE NEGATIVE Final    Comment:        The GeneXpert MRSA Assay (FDA approved for NASAL specimens only), is one component of a comprehensive MRSA colonization surveillance program. It is not intended to diagnose MRSA infection nor to guide or monitor treatment for MRSA infections. Performed at [facility], [address], [place], [place] [address]      Labs: Basic Metabolic Panel: Recent Labs  Lab 01/15/19 0022 01/16/19 0343  NA 138 139  K 4.0 3.4*  CL 108 107  CO2 22 22  GLUCOSE 95 107*  BUN 15 13  CREATININE 0.69 0.69  CALCIUM 8.3* 8.9   Liver Function Tests: Recent Labs  Lab 01/15/19 0022  AST 23  ALT 19  ALKPHOS 85  BILITOT 0.4  PROT 6.2*  ALBUMIN 3.2*   No results for input(s): LIPASE, AMYLASE in the last 168 hours. No results for input(s): AMMONIA in the last 168 hours. CBC: Recent Labs  Lab 01/15/19 0022 01/15/19 0817 01/16/19 0343  WBC 9.7 7.2 6.1  HGB 8.8* 8.3* 8.4*  HCT 28.7* 27.0* 27.1*  MCV 91.7 91.8 91.6  PLT 244 296 335   Cardiac Enzymes: No results for input(s): CKTOTAL, CKMB, CKMBINDEX, TROPONINI in the last 168 hours. BNP: BNP (last 3 results) No results for input(s): BNP in the last 8760 hours.  ProBNP (last 3 results) No results for input(s): PROBNP in the last 8760 hours.  CBG: No results for input(s): GLUCAP in the last 168 hours.     Signed:  Zannie Cove MD.  [facility] 01/16/2019, 11:32 AM

## 2019-01-16 NOTE — Progress Notes (Signed)
Pt given discharge instructions, prescriptions, and care notes. Pt verbalized understanding AEB no further questions or concerns at this time. IV was discontinued, no redness, pain, or swelling noted at this time. Telemetry discontinued and Centralized Telemetry was notified. Pt left the floor via wheelchair with staff in stable condition. 

## 2019-01-16 NOTE — Care Management Obs Status (Signed)
[facility] NOTIFICATION   Patient Details  Name: Maria Cameron MRN: [id number] Date of Birth: Nov 01, 1923   Medicare Observation Status Notification Given:  Yes    Sharin Mons, RN 01/16/2019, 10:51 AM

## 2019-01-16 NOTE — Progress Notes (Signed)
Report given to receiving nurse at [facility].

## 2019-01-16 NOTE — TOC Transition Note (Signed)
[facility]) - CM/SW Discharge Note   Patient Details  Name: Maria Cameron MRN: [id number] Date of Birth: 07/11/1923  [facility]) CM/SW Contact:  Geralynn Ochs, LCSW Phone Number: 01/16/2019, 1:12 PM   Clinical Narrative:   Nurse to call report to [phone].  ALF to pick up patient. Transport set for 2:00 PM. Driver will call nursing station when he has arrived.     Final next level of care: Assisted Living Barriers to Discharge: Barriers Resolved   Patient Goals and CMS Choice Patient states their goals for this hospitalization and ongoing recovery are:: patient unable to participate in goal setting due to confusion      Discharge Placement                       Discharge Plan and Services                          HH Arranged: PT          Social Determinants of Health (SDOH) Interventions     Readmission Risk Interventions No flowsheet data found.

## 2019-01-16 NOTE — NC FL2 (Signed)
[place] MEDICAID FL2 LEVEL OF CARE SCREENING TOOL     IDENTIFICATION  Patient Name: Maria Cameron Birthdate: [birth year] Sex: female Admission Date (Current Location): 01/14/2019  [facility] and [place] Number:  [employer] and Address:  [facility], [address] [place], [place] [address]      Provider Number: [id number]  Attending Physician Name and Address:  Zannie Cove, MD  Relative Name and Phone Number:       Current Level of Care: Hospital Recommended Level of Care: Assisted Living Facility Prior Approval Number:    Date Approved/Denied:   PASRR Number:    Discharge Plan: Other (Comment)([facility])    Current Diagnoses: Patient Active Problem List   Diagnosis Date Noted  . Anemia 01/14/2019  . Symptomatic anemia 01/14/2019  . Displaced comminuted fracture of shaft of right femur, initial encounter for closed fracture (HCC) 09/28/2017  . Right femoral fracture (HCC) 09/25/2017  . Warfarin-induced coagulopathy (HCC) 09/25/2017  . CAD (coronary artery disease) 09/25/2017  . Hypothyroid 09/25/2017  . GERD (gastroesophageal reflux disease) 09/25/2017  . Depression 09/25/2017    Orientation RESPIRATION BLADDER Height & Weight     Self, Place(Pt with hx of dementia, confused)  Normal Continent, Incontinent Weight: 156 lb (70.8 kg) Height:  5\' 5"  (165.1 cm)  BEHAVIORAL SYMPTOMS/MOOD NEUROLOGICAL BOWEL NUTRITION STATUS      Continent Diet(see DC summary)  AMBULATORY STATUS COMMUNICATION OF NEEDS Skin   Supervision Verbally Normal                       Personal Care Assistance Level of Assistance  Bathing, Dressing, Feeding Bathing Assistance: Limited assistance Feeding assistance: Independent Dressing Assistance: Limited assistance     Functional Limitations Info  Sight, Hearing, Speech Sight Info: Impaired(blind,macular degenerate) Hearing Info: Adequate Speech Info: Adequate    SPECIAL CARE FACTORS  FREQUENCY                       Contractures Contractures Info: Not present    Additional Factors Info  Code Status, Allergies, Psychotropic Code Status Info: DNR Allergies Info: Codeine, Coffee Flavor, Green Tea Leaf Ext, Chocolate Flavor, Lemon Flavor, Lemon Oil Psychotropic Info: Lexapro 10mg  daily; Remeron 7.5mg  daily at bed; Ativan 0.25mg  PRN daily         Current Medications (01/16/2019):  This is the current hospital active medication list Current Facility-Administered Medications  Medication Dose Route Frequency Provider Last Rate Last Dose  . 0.9 %  sodium chloride infusion  250 mL Intravenous PRN , MD      . acetaminophen (TYLENOL) tablet 650 mg  650 mg Oral Q6H PRN 01/18/2019, MD   650 mg at 01/15/19 0127   Or  . acetaminophen (TYLENOL) suppository 650 mg  650 mg Rectal Q6H PRN Pearson Grippe, MD      . albuterol (PROVENTIL) (2.5 MG/3ML) 0.083% nebulizer solution 3 mL  3 mL Inhalation Q6H PRN 03/18/19, MD      . amLODipine (NORVASC) tablet 5 mg  5 mg Oral Daily Pearson Grippe, MD   5 mg at 01/15/19 0913  . atorvastatin (LIPITOR) tablet 20 mg  20 mg Oral QHS Pearson Grippe, MD   20 mg at 01/15/19 2106  . carbamazepine (TEGRETOL) chewable tablet 100 mg  100 mg Oral BID 03/18/19, MD   100 mg at 01/15/19 2110  . dicyclomine (BENTYL) capsule 10 mg  10 mg Oral TID  AC & HS Pearson GrippeKim, James, MD   10 mg at 01/15/19 2105  . escitalopram (LEXAPRO) tablet 10 mg  10 mg Oral Daily Pearson GrippeKim, James, MD   10 mg at 01/15/19 0913  . hydrALAZINE (APRESOLINE) injection 10 mg  10 mg Intravenous Q6H PRN Pearson GrippeKim, James, MD   10 mg at 01/15/19 0034  . HYDROcodone-acetaminophen (NORCO) 7.5-325 MG per tablet 1 tablet  1 tablet Oral Q4H PRN Pearson GrippeKim, James, MD   1 tablet at 01/15/19 0228  . levothyroxine (SYNTHROID) tablet 25 mcg  25 mcg Oral Q0600 Pearson GrippeKim, James, MD   25 mcg at 01/15/19 [id number]  . loperamide (IMODIUM) capsule 2 mg  2 mg Oral PRN Pearson GrippeKim, James, MD      . metoprolol tartrate (LOPRESSOR) tablet 25 mg  25 mg  Oral BID Pearson GrippeKim, James, MD   25 mg at 01/15/19 2105  . mirtazapine (REMERON) tablet 7.5 mg  7.5 mg Oral QHS Pearson GrippeKim, James, MD   7.5 mg at 01/15/19 2106  . pantoprazole (PROTONIX) EC tablet 40 mg  40 mg Oral Q1200 Zannie CoveJoseph, Preetha, MD   40 mg at 01/15/19 1232  . polyethylene glycol (MIRALAX / GLYCOLAX) packet 17 g  17 g Oral BID Zannie CoveJoseph, Preetha, MD   17 g at 01/16/19 1006  . polyvinyl alcohol (LIQUIFILM TEARS) 1.4 % ophthalmic solution 1 drop  1 drop Both Eyes Daily PRN Pearson GrippeKim, James, MD      . senna-docusate (Senokot-S) tablet 1 tablet  1 tablet Oral BID Zannie CoveJoseph, Preetha, MD   1 tablet at 01/15/19 2106  . sodium chloride flush (NS) 0.9 % injection 3 mL  3 mL Intravenous Q12H Pearson GrippeKim, James, MD   3 mL at 01/16/19 0835  . sodium chloride flush (NS) 0.9 % injection 3 mL  3 mL Intravenous PRN Pearson GrippeKim, James, MD         Discharge Medications: STOP taking these medications   aspirin 81 MG chewable tablet   diclofenac 50 MG tablet Commonly known as: CATAFLAM   diphenhydrAMINE 25 MG tablet Commonly known as: BENADRYL   enoxaparin 40 MG/0.4ML injection Commonly known as: LOVENOX   HYDROcodone-acetaminophen 7.5-325 MG tablet Commonly known as: NORCO   LORazepam 0.5 MG tablet Commonly known as: ATIVAN     TAKE these medications   acetaminophen 325 MG tablet Commonly known as: TYLENOL Take 650 mg by mouth every 6 (six) hours as needed for mild pain or fever.   albuterol 108 (90 Base) MCG/ACT inhaler Commonly known as: VENTOLIN HFA Inhale 2 puffs into the lungs every 6 (six) hours as needed for wheezing or shortness of breath.   alum & mag hydroxide-simeth 200-200-20 MG/5ML suspension Commonly known as: MAALOX/MYLANTA Take 30 mLs by mouth every 6 (six) hours as needed for indigestion or heartburn.   amLODipine 5 MG tablet Commonly known as: NORVASC Take 5 mg by mouth daily.   anti-nausea solution Take 30 mLs by mouth every 15 (fifteen) minutes as needed for nausea or vomiting.    ARIPiprazole 2 MG tablet Commonly known as: ABILIFY Take 2 mg by mouth daily.   atorvastatin 20 MG tablet Commonly known as: LIPITOR Take 20 mg by mouth at bedtime.   benzonatate 100 MG capsule Commonly known as: TESSALON Take 100 mg by mouth 3 (three) times daily as needed for cough.   bisacodyl 10 MG suppository Commonly known as: Dulcolax Place 1 suppository (10 mg total) rectally daily as needed for moderate constipation.   carbamazepine 100 MG chewable tablet Commonly known as:  TEGRETOL Chew 100 mg by mouth 2 (two) times daily.   dicyclomine 10 MG capsule Commonly known as: BENTYL Take 10 mg by mouth 2 times daily at 12 noon and 4 pm.   escitalopram 10 MG tablet Commonly known as: LEXAPRO Take 10 mg by mouth daily.   gabapentin 100 MG capsule Commonly known as: NEURONTIN Take 100 mg by mouth 3 (three) times daily.   guaifenesin 100 MG/5ML syrup Commonly known as: ROBITUSSIN Take 200 mg by mouth 4 (four) times daily as needed for cough.   latanoprost 0.005 % ophthalmic solution Commonly known as: XALATAN Place 1 drop into both eyes at bedtime.   levothyroxine 25 MCG tablet Commonly known as: SYNTHROID Take 25 mcg by mouth daily before breakfast.   loperamide 2 MG capsule Commonly known as: IMODIUM Take 2 mg by mouth as needed for diarrhea or loose stools.   loratadine 10 MG tablet Commonly known as: CLARITIN Take 10 mg by mouth daily.   magnesium hydroxide 400 MG/5ML suspension Commonly known as: MILK OF MAGNESIA Take 30 mLs by mouth daily as needed for mild constipation.   metoprolol tartrate 25 MG tablet Commonly known as: LOPRESSOR Take 1 tablet (25 mg total) by mouth 2 (two) times daily.   mirtazapine 15 MG tablet Commonly known as: REMERON Take 15 mg by mouth at bedtime.   ondansetron 4 MG tablet Commonly known as: ZOFRAN Take 4 mg by mouth every 6 (six) hours as needed for nausea.   pantoprazole 40 MG tablet Commonly  known as: PROTONIX Take 40 mg by mouth daily.   polyethylene glycol 17 g packet Commonly known as: MIRALAX / GLYCOLAX Take 17 g by mouth daily. What changed: when to take this   Refresh Plus 0.5 % Soln Generic drug: Carboxymethylcellulose Sod PF Place 1 drop into both eyes daily as needed (for eye irritation).   senna-docusate 8.6-50 MG tablet Commonly known as: Senokot-S Take 1 tablet by mouth 2 (two) times daily.   Xarelto 20 MG Tabs tablet Generic drug: rivaroxaban Take 1 tablet (20 mg total) by mouth at bedtime. Hold for 1 week What changed: additional instructions     Relevant Imaging Results:  Relevant Lab Results:   Additional Information SS#: [place]

## 2019-01-18 LAB — NOVEL CORONAVIRUS, NAA (HOSP ORDER, SEND-OUT TO REF LAB; TAT 18-24 HRS): SARS-CoV-2, NAA: NOT DETECTED

## 2019-05-19 DIAGNOSIS — I340 Nonrheumatic mitral (valve) insufficiency: Secondary | ICD-10-CM | POA: Diagnosis not present

## 2019-05-19 DIAGNOSIS — I361 Nonrheumatic tricuspid (valve) insufficiency: Secondary | ICD-10-CM

## 2019-06-19 ENCOUNTER — Inpatient Hospital Stay: Admission: AD | Admit: 2019-06-19 | Payer: Medicare Other | Admitting: Internal Medicine

## 2019-06-19 DIAGNOSIS — J9601 Acute respiratory failure with hypoxia: Secondary | ICD-10-CM | POA: Diagnosis not present

## 2019-06-19 DIAGNOSIS — U071 COVID-19: Secondary | ICD-10-CM | POA: Diagnosis not present

## 2019-06-19 DIAGNOSIS — E876 Hypokalemia: Secondary | ICD-10-CM | POA: Diagnosis not present

## 2019-06-27 DIAGNOSIS — F0390 Unspecified dementia without behavioral disturbance: Secondary | ICD-10-CM | POA: Insufficient documentation

## 2019-06-27 DIAGNOSIS — E785 Hyperlipidemia, unspecified: Secondary | ICD-10-CM | POA: Insufficient documentation

## 2019-06-27 DIAGNOSIS — I2782 Chronic pulmonary embolism: Secondary | ICD-10-CM

## 2019-06-27 DIAGNOSIS — I2699 Other pulmonary embolism without acute cor pulmonale: Secondary | ICD-10-CM | POA: Insufficient documentation

## 2019-06-27 HISTORY — DX: Chronic pulmonary embolism: I27.82

## 2020-06-02 IMAGING — CT CT HEAD WITHOUT CONTRAST
4 series · 15 of 47 positions shown, 17 images · non-contrast
Comparison: Prior CT from 09/25/2018.

CLINICAL DATA: Initial evaluation for acute altered mental status.

EXAM:
CT HEAD WITHOUT CONTRAST
TECHNIQUE: Contiguous axial images were obtained from the base of the skull
through the vertex without intravenous contrast.

[Series 3: head without · axial · non-contrast · 0.43mm/px · z∈[-93,+32]mm · 7 of 35 slices shown, 9 images]
[im 5/35  brain]
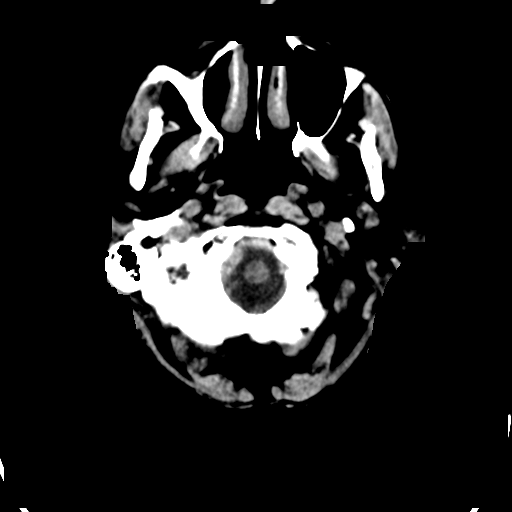
[im 5/35  bone]
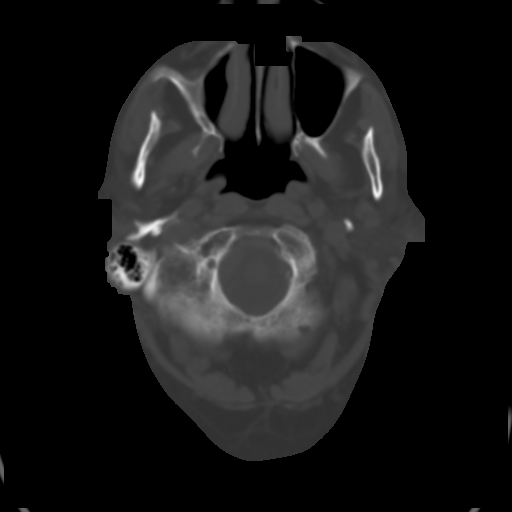
[im 9/35  brain]
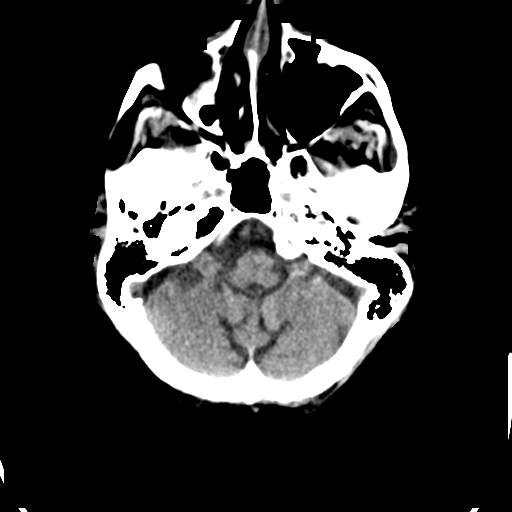
[im 13/35  brain]
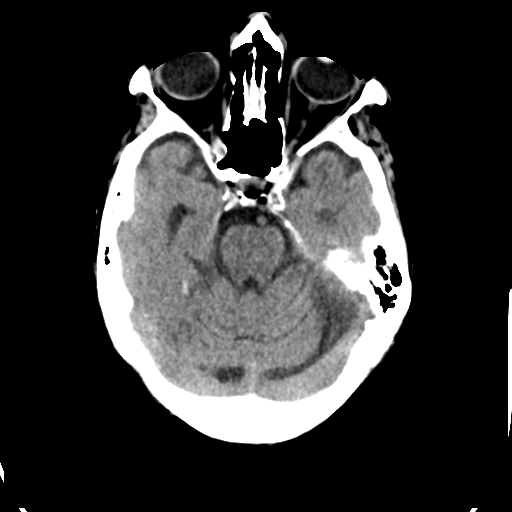
[im 18/35  brain]
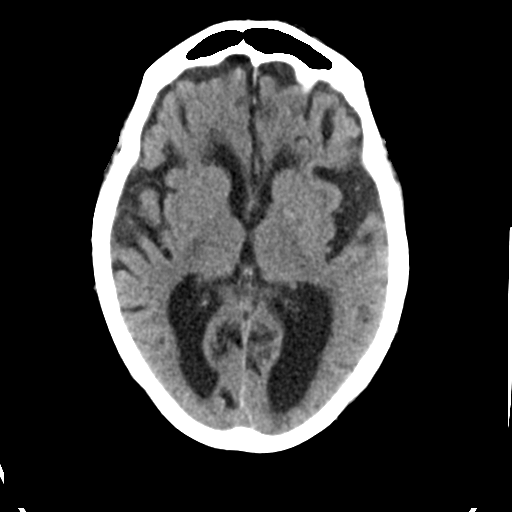
[im 22/35  brain]
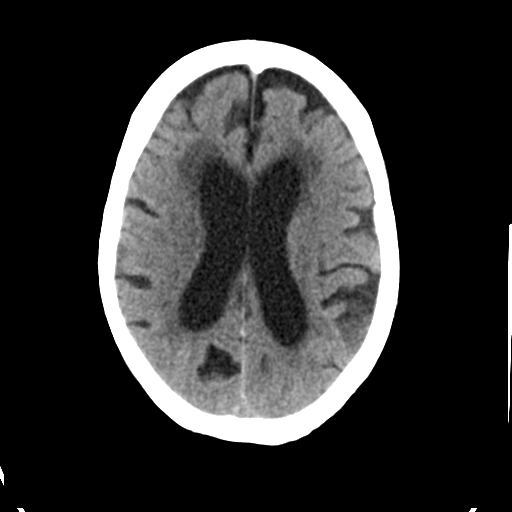
[im 22/35  bone]
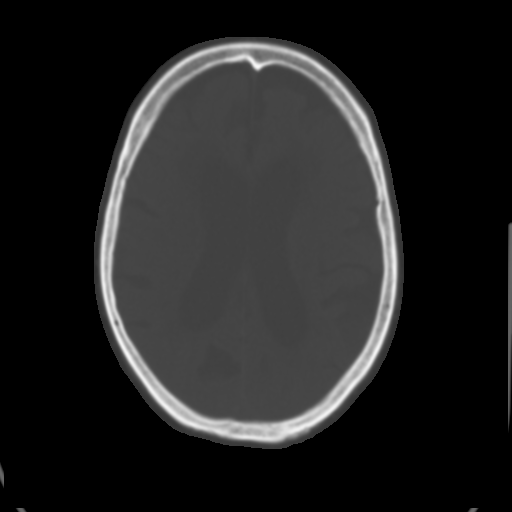
[im 26/35  brain]
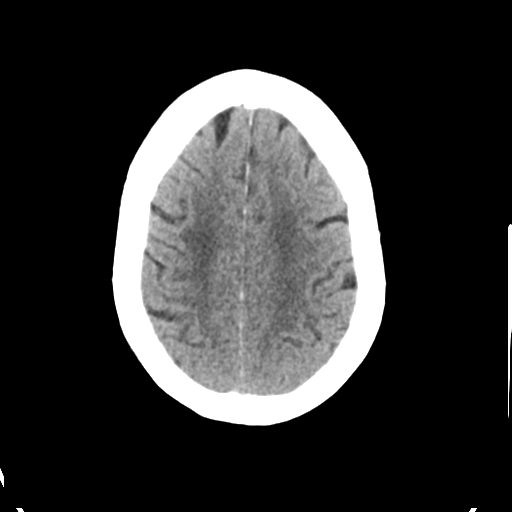
[im 30/35  brain]
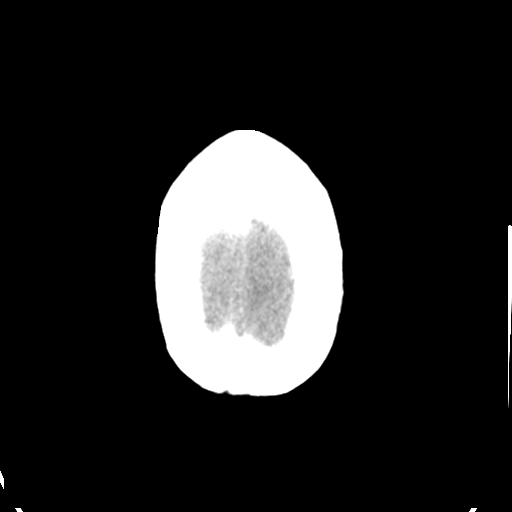

[Series 4: head bone · axial · 0.43mm/px · z∈[-97,-79]mm · 2 of 87 slices shown]
[im 9/87  bone]
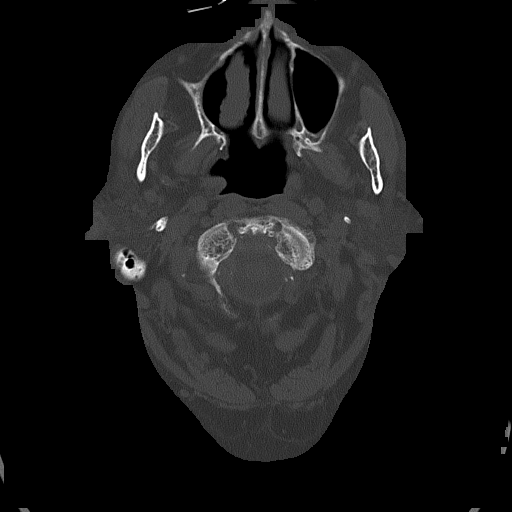
[im 18/87  bone]
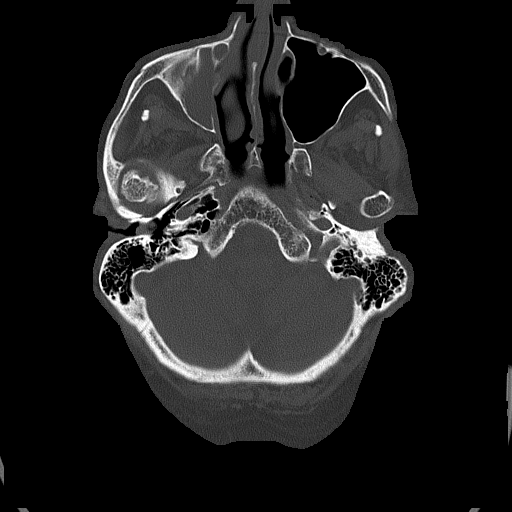

[Series 5: head without cor · coronal · non-contrast · 0.34mm/px · 3 of 78 slices shown]
[im 26/78  brain]
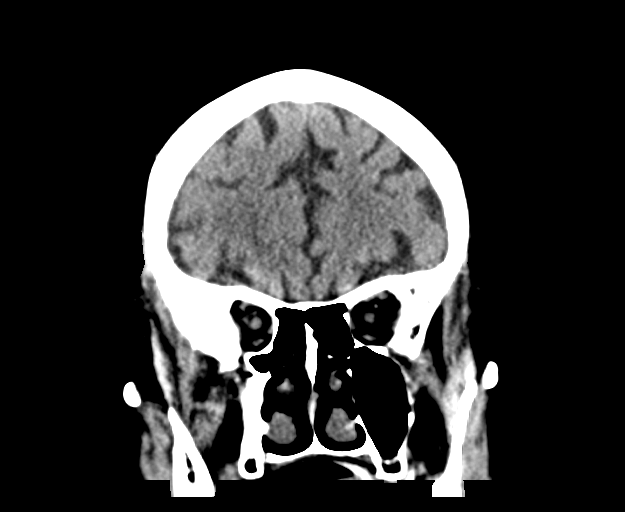
[im 35/78  brain]
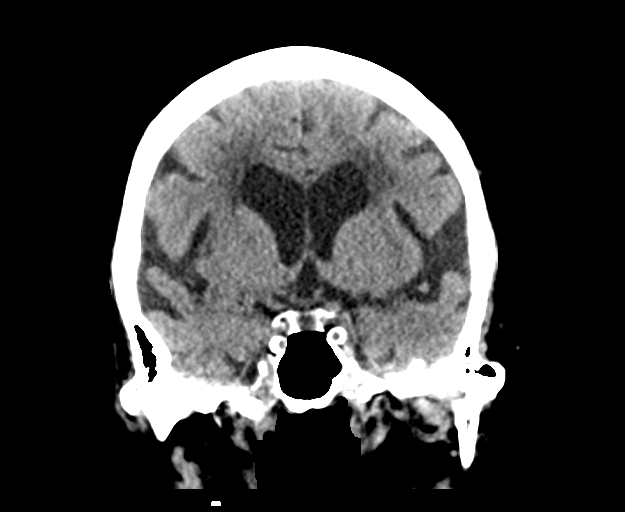
[im 43/78  brain]
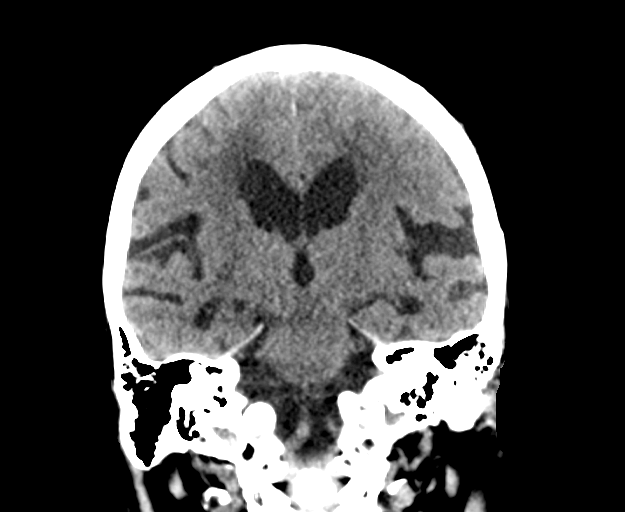

[Series 6: head without sag · sagittal · non-contrast · 0.34mm/px · 3 of 67 slices shown]
[im 23/67  brain]
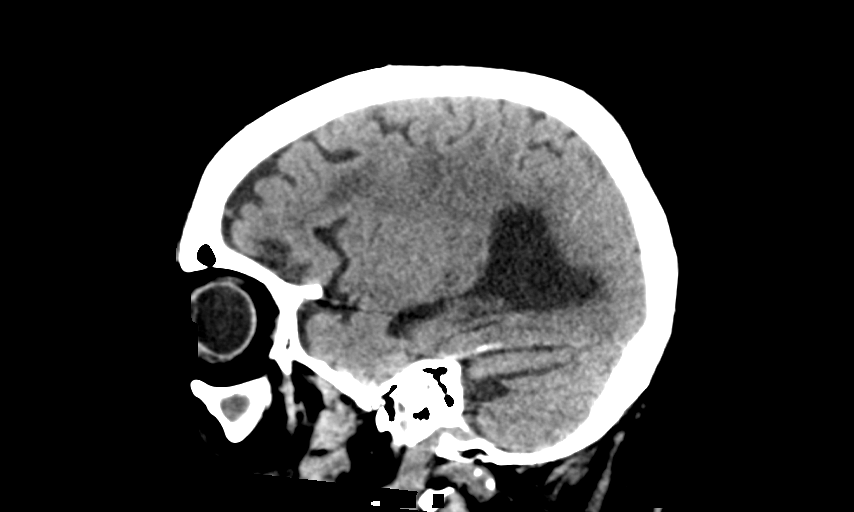
[im 34/67  brain]
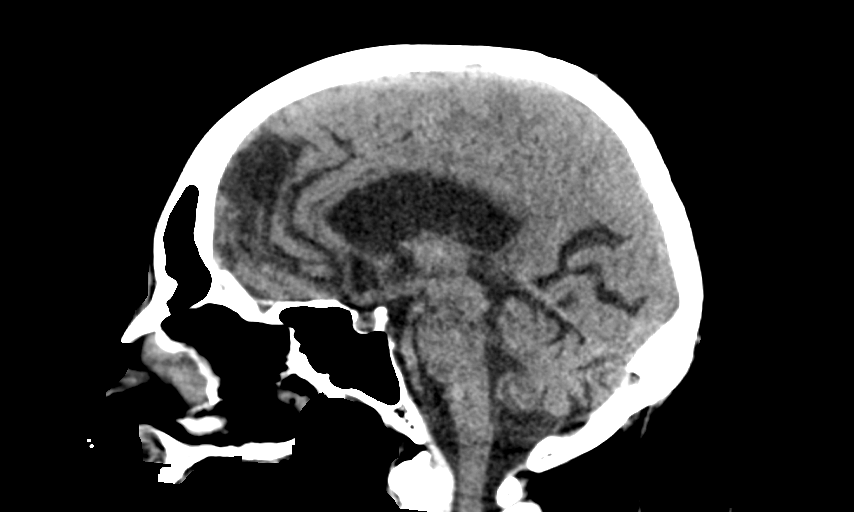
[im 45/67  brain]
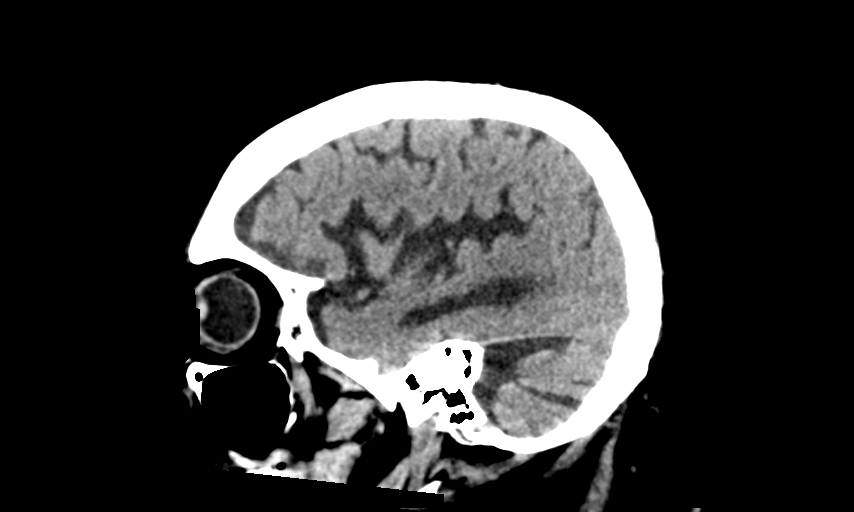

[15 of 47 positions shown; findings below may reference images not displayed]

FINDINGS: Brain: Age-related cerebral atrophy with chronic microvascular
ischemic disease, stable. No acute intracranial hemorrhage. No acute
large vessel territory infarct. No mass lesion, midline shift or
mass effect. Diffuse ventricular prominence related global
parenchymal volume loss without hydrocephalus, stable. No
extra-axial fluid collection.

Vascular: No hyperdense vessel. Scattered vascular calcifications
noted within the carotid siphons.

Skull: Scalp soft tissues and calvarium within normal limits.

Sinuses/Orbits: Globes and orbital soft tissues within normal
limits. Patient status post ocular lens replacement on the right.
Hypoplastic and opacified right maxillary sinus, compatible silent
sinus syndrome. Paranasal sinuses are otherwise clear. No mastoid
effusion.

Other: None.
IMPRESSION: 1. No acute intracranial abnormality.
2. Age-related cerebral atrophy with mild chronic microvascular
ischemic disease, stable.
3. Right maxillary sinusitis.

## 2020-08-27 ENCOUNTER — Encounter: Payer: Self-pay | Admitting: *Deleted

## 2020-08-27 ENCOUNTER — Encounter: Payer: Self-pay | Admitting: Cardiology

## 2020-08-27 DIAGNOSIS — H3530 Unspecified macular degeneration: Secondary | ICD-10-CM | POA: Insufficient documentation

## 2020-08-27 DIAGNOSIS — J8410 Pulmonary fibrosis, unspecified: Secondary | ICD-10-CM | POA: Insufficient documentation

## 2020-08-27 DIAGNOSIS — I10 Essential (primary) hypertension: Secondary | ICD-10-CM | POA: Insufficient documentation

## 2020-08-27 HISTORY — DX: Unspecified macular degeneration: H35.30

## 2020-09-10 DIAGNOSIS — M069 Rheumatoid arthritis, unspecified: Secondary | ICD-10-CM | POA: Insufficient documentation

## 2020-09-10 DIAGNOSIS — I10 Essential (primary) hypertension: Secondary | ICD-10-CM | POA: Insufficient documentation

## 2020-09-10 DIAGNOSIS — I482 Chronic atrial fibrillation, unspecified: Secondary | ICD-10-CM

## 2020-09-10 DIAGNOSIS — J4530 Mild persistent asthma, uncomplicated: Secondary | ICD-10-CM | POA: Insufficient documentation

## 2020-09-10 DIAGNOSIS — H3530 Unspecified macular degeneration: Secondary | ICD-10-CM | POA: Insufficient documentation

## 2020-09-10 DIAGNOSIS — K644 Residual hemorrhoidal skin tags: Secondary | ICD-10-CM

## 2020-09-10 DIAGNOSIS — K589 Irritable bowel syndrome without diarrhea: Secondary | ICD-10-CM

## 2020-09-10 DIAGNOSIS — R1310 Dysphagia, unspecified: Secondary | ICD-10-CM

## 2020-09-10 DIAGNOSIS — Z7901 Long term (current) use of anticoagulants: Secondary | ICD-10-CM

## 2020-09-10 DIAGNOSIS — E782 Mixed hyperlipidemia: Secondary | ICD-10-CM

## 2020-09-10 DIAGNOSIS — M8940 Other hypertrophic osteoarthropathy, unspecified site: Secondary | ICD-10-CM

## 2020-09-10 DIAGNOSIS — D518 Other vitamin B12 deficiency anemias: Secondary | ICD-10-CM

## 2020-09-10 DIAGNOSIS — R4182 Altered mental status, unspecified: Secondary | ICD-10-CM

## 2020-09-10 DIAGNOSIS — I2510 Atherosclerotic heart disease of native coronary artery without angina pectoris: Secondary | ICD-10-CM | POA: Insufficient documentation

## 2020-09-10 DIAGNOSIS — K219 Gastro-esophageal reflux disease without esophagitis: Secondary | ICD-10-CM | POA: Insufficient documentation

## 2020-09-10 DIAGNOSIS — F0280 Dementia in other diseases classified elsewhere without behavioral disturbance: Secondary | ICD-10-CM

## 2020-09-10 DIAGNOSIS — E039 Hypothyroidism, unspecified: Secondary | ICD-10-CM | POA: Insufficient documentation

## 2020-09-10 DIAGNOSIS — I4811 Longstanding persistent atrial fibrillation: Secondary | ICD-10-CM

## 2020-09-10 DIAGNOSIS — E559 Vitamin D deficiency, unspecified: Secondary | ICD-10-CM

## 2020-09-10 DIAGNOSIS — E119 Type 2 diabetes mellitus without complications: Secondary | ICD-10-CM

## 2020-09-10 DIAGNOSIS — R269 Unspecified abnormalities of gait and mobility: Secondary | ICD-10-CM

## 2020-09-10 DIAGNOSIS — F329 Major depressive disorder, single episode, unspecified: Secondary | ICD-10-CM

## 2020-09-10 DIAGNOSIS — G629 Polyneuropathy, unspecified: Secondary | ICD-10-CM | POA: Insufficient documentation

## 2020-09-10 DIAGNOSIS — I739 Peripheral vascular disease, unspecified: Secondary | ICD-10-CM | POA: Insufficient documentation

## 2020-09-10 DIAGNOSIS — D509 Iron deficiency anemia, unspecified: Secondary | ICD-10-CM

## 2020-09-10 DIAGNOSIS — R296 Repeated falls: Secondary | ICD-10-CM

## 2020-09-10 HISTORY — DX: Atherosclerotic heart disease of native coronary artery without angina pectoris: I25.10

## 2020-09-10 HISTORY — DX: Chronic atrial fibrillation, unspecified: I48.20

## 2020-09-10 HISTORY — DX: Repeated falls: R29.6

## 2020-09-10 HISTORY — DX: Peripheral vascular disease, unspecified: I73.9

## 2020-09-10 HISTORY — DX: Residual hemorrhoidal skin tags: K64.4

## 2020-09-10 HISTORY — DX: Irritable bowel syndrome without diarrhea: K58.9

## 2020-09-10 HISTORY — DX: Unspecified abnormalities of gait and mobility: R26.9

## 2020-09-10 HISTORY — DX: Dementia in other diseases classified elsewhere, unspecified severity, without behavioral disturbance, psychotic disturbance, mood disturbance, and anxiety: F02.80

## 2020-09-10 HISTORY — DX: Altered mental status, unspecified: R41.82

## 2020-09-10 HISTORY — DX: Vitamin D deficiency, unspecified: E55.9

## 2020-09-10 HISTORY — DX: Long term (current) use of anticoagulants: Z79.01

## 2020-09-10 HISTORY — DX: Hypothyroidism, unspecified: E03.9

## 2020-09-10 HISTORY — DX: Gastro-esophageal reflux disease without esophagitis: K21.9

## 2020-09-10 HISTORY — DX: Other hypertrophic osteoarthropathy, unspecified site: M89.40

## 2020-09-10 HISTORY — DX: Essential (primary) hypertension: I10

## 2020-09-10 HISTORY — DX: Dysphagia, unspecified: R13.10

## 2020-09-10 HISTORY — DX: Iron deficiency anemia, unspecified: D50.9

## 2020-09-10 HISTORY — DX: Other vitamin B12 deficiency anemias: D51.8

## 2020-09-10 HISTORY — DX: Longstanding persistent atrial fibrillation: I48.11

## 2020-09-10 HISTORY — DX: Polyneuropathy, unspecified: G62.9

## 2020-09-10 HISTORY — DX: Mixed hyperlipidemia: E78.2

## 2020-09-10 HISTORY — DX: Type 2 diabetes mellitus without complications: E11.9

## 2020-09-10 HISTORY — DX: Major depressive disorder, single episode, unspecified: F32.9

## 2020-09-11 ENCOUNTER — Other Ambulatory Visit: Payer: Self-pay

## 2020-09-11 ENCOUNTER — Ambulatory Visit (INDEPENDENT_AMBULATORY_CARE_PROVIDER_SITE_OTHER): Payer: Medicare Other | Admitting: Cardiology

## 2020-09-11 ENCOUNTER — Encounter: Payer: Self-pay | Admitting: Cardiology

## 2020-09-11 VITALS — BP 130/78 | HR 61 | Ht 65.0 in | Wt 154.0 lb

## 2020-09-11 DIAGNOSIS — I442 Atrioventricular block, complete: Secondary | ICD-10-CM | POA: Diagnosis not present

## 2020-09-11 DIAGNOSIS — Z950 Presence of cardiac pacemaker: Secondary | ICD-10-CM | POA: Diagnosis not present

## 2020-09-11 DIAGNOSIS — Z86711 Personal history of pulmonary embolism: Secondary | ICD-10-CM

## 2020-09-11 DIAGNOSIS — I2510 Atherosclerotic heart disease of native coronary artery without angina pectoris: Secondary | ICD-10-CM

## 2020-09-11 HISTORY — DX: Personal history of pulmonary embolism: Z86.711

## 2020-09-11 NOTE — Progress Notes (Signed)
Cardiology Office Note:    Date:  09/11/2020   ID:  Delia Slatten, DOB 11-09-1923, MRN [id number]  PCP:  Galvin Proffer, MD  Cardiologist:  Garwin Brothers, MD   Referring MD: Marcellus Scott, MD    ASSESSMENT:    1. Heart block AV complete (HCC)   2. Coronary artery disease involving native coronary artery of native heart without angina pectoris   3. Presence of cardiac pacemaker    PLAN:    In order of problems listed above:  1. Coronary artery disease: Details are unclear.  Secondary prevention stressed with the patient.  Importance of compliance with diet medication stressed and she vocalized understanding. 2. Essential hypertension: Blood pressure stable and diet was emphasized.  Salt intake issues were discussed. 3. Mixed dyslipidemia: Diet was emphasized.  Patient on statin therapy managed by primary care. 4. Permanent pacemaker: We will set her up with our electrophysiology colleagues for a pacemaker check.  We do not know the details of her pacemaker. 5. History of pulmonary embolism: On anticoagulation.  This is managed by primary care. 6. Patient will be seen in follow-up appointment in 6 months or earlier if the patient has any concerns    Medication Adjustments/Labs and Tests Ordered: Current medicines are reviewed at length with the patient today.  Concerns regarding medicines are outlined above.  Orders Placed This Encounter  Procedures  . Ambulatory referral to Cardiac Electrophysiology  . EKG 12-Lead   No orders of the defined types were placed in this encounter.    No chief complaint on file.    History of Present Illness:    Maria Cameron is a 87 y.o. female.  Patient is an elderly female.  She has history of dementia.  She is a poor historian.  She is brought in by attendant.  She does not give much of a history.  There is history of coronary artery disease and permanent pacemaker.  She is brought in in a wheelchair.  There is history of pulmonary embolism  for which she is on anticoagulation.  She has history of hypertension.  She denies any symptoms.  She answers fairly appropriately to my basic questions.  Past Medical History:  Diagnosis Date  . Abnormal gait 09/10/2020  . Altered mental status 09/10/2020  . Alzheimer's disease with late onset (HCC) 09/10/2020  . Anemia   . Atherosclerotic heart disease of native coronary artery without angina pectoris 09/10/2020  . CAD (coronary artery disease)   . Chest pain 03/12/2014  . Chronic atrial fibrillation, unspecified (HCC) 09/10/2020  . Cough 03/12/2014  . Dementia (HCC)   . Depression 09/25/2017  . Displaced comminuted fracture of shaft of right femur, initial encounter for closed fracture (HCC) 09/28/2017  . Dysphagia 09/10/2020  . Essential hypertension 09/10/2020  . Gastro-esophageal reflux disease without esophagitis 09/10/2020  . GERD (gastroesophageal reflux disease) 09/25/2017  . Heart block AV complete (HCC) 03/14/2014  . Hyperlipidemia   . Hypertension   . Hypertrophic osteoarthropathy 09/10/2020  . Hypothyroid 09/25/2017  . Hypothyroidism 09/10/2020  . Iron deficiency anemia 09/10/2020  . Irritable bowel syndrome 09/10/2020  . Long term (current) use of anticoagulants 09/10/2020  . Longstanding persistent atrial fibrillation (HCC) 09/10/2020  . Macular degeneration   . Macular degeneration of both eyes 08/27/2020  . Major depression, single episode 09/10/2020  . Mild persistent asthma   . Mixed hyperlipidemia 09/10/2020  . Other vitamin B12 deficiency anemias 09/10/2020  . Peripheral vascular disease (HCC) 09/10/2020  . Polyneuropathy 09/10/2020  .  Presence of cardiac pacemaker 06/18/2014  . Pulmonary embolism (HCC)    per [facility] ER records  . Pulmonary fibrosis (HCC)   . Recurrent falls 09/10/2020  . Residual hemorrhoidal skin tags 09/10/2020  . Rheumatoid arthritis (HCC)    per [facility] ER records  . Right femoral fracture (HCC) 09/25/2017  . Sick sinus syndrome (HCC)   . Symptomatic anemia 01/14/2019  .  Syncope 11/23/2014  . Type 2 diabetes mellitus without complications (HCC) 09/10/2020  . Vitamin D deficiency 09/10/2020  . Warfarin-induced coagulopathy (HCC) 09/25/2017    Past Surgical History:  Procedure Laterality Date  . ABDOMINAL HYSTERECTOMY     per [facility] ER record  . CARDIAC CATHETERIZATION    . CHOLECYSTECTOMY     per [facility] ER record  . ORIF FEMUR FRACTURE Right 09/28/2017   Procedure: OPEN REDUCTION INTERNAL FIXATION FEMORAL SHAFT FRACTURE;  Surgeon: Myrene Galas, MD;  Location: [facility] OR;  Service: Orthopedics;  Laterality: Right;  . PACEMAKER PLACEMENT  03/14/2014   DDD PPM - [employer]    . TONSILLECTOMY AND ADENOIDECTOMY      Current Medications: Current Meds  Medication Sig  . acetaminophen (TYLENOL) 325 MG tablet Take 650 mg by mouth every 6 (six) hours as needed for mild pain.  Marland Kitchen amLODipine (NORVASC) 10 MG tablet Take 10 mg by mouth daily.  Marland Kitchen apixaban (ELIQUIS) 5 MG TABS tablet Take 5 mg by mouth 2 (two) times daily.  . ARIPiprazole (ABILIFY) 5 MG tablet Take 5 mg by mouth daily.  Marland Kitchen atorvastatin (LIPITOR) 20 MG tablet Take 20 mg by mouth at bedtime.  . carbamide peroxide (DEBROX) 6.5 % OTIC solution Place 10 drops into both ears at bedtime.  . Carboxymethylcellulose Sod PF 0.5 % SOLN Apply 1 drop to eye daily.  Marland Kitchen dicyclomine (BENTYL) 10 MG capsule Take 10 mg by mouth 2 times daily at 12 noon and 4 pm.   . escitalopram (LEXAPRO) 10 MG tablet Take 10 mg by mouth daily.  . fluticasone furoate-vilanterol (BREO ELLIPTA) 100-25 MCG/INH AEPB Inhale 1 puff into the lungs daily.  Marland Kitchen gabapentin (NEURONTIN) 300 MG capsule Take 300 mg by mouth 3 (three) times daily.  Marland Kitchen guaifenesin (ROBITUSSIN) 100 MG/5ML syrup Take 200 mg by mouth 4 (four) times daily as needed for cough.  . latanoprost (XALATAN) 0.005 % ophthalmic solution Place 1 drop into both eyes at bedtime.  Marland Kitchen levothyroxine (SYNTHROID, LEVOTHROID) 25 MCG tablet Take 25 mcg by mouth daily before breakfast.  . meloxicam  (MOBIC) 15 MG tablet Take 15 mg by mouth daily.  . metoprolol tartrate (LOPRESSOR) 25 MG tablet Take 25 mg by mouth 2 (two) times daily.  . mirtazapine (REMERON) 15 MG tablet Take 15 mg by mouth at bedtime.   . pantoprazole (PROTONIX) 40 MG tablet Take 40 mg by mouth daily.     Allergies:   Codeine, Coffee flavor, Green tea leaf ext, Chocolate flavor, Lemon flavor, and Lemon oil   Social History   Socioeconomic History  . Marital status: Widowed    Spouse name: Not on file  . Number of children: Not on file  . Years of education: Not on file  . Highest education level: Not on file  Occupational History  . Not on file  Tobacco Use  . Smoking status: Never Smoker  . Smokeless tobacco: Never Used  Substance and Sexual Activity  . Alcohol use: Not Currently  . Drug use: Not on file  . Sexual activity: Not on file  Other Topics Concern  .  Not on file  Social History Narrative  . Not on file   Social Determinants of Health   Financial Resource Strain: Not on file  Food Insecurity: Not on file  Transportation Needs: Not on file  Physical Activity: Not on file  Stress: Not on file  Social Connections: Not on file     Family History: The patient's family history includes CAD in her brother and father.  ROS:   Please see the history of present illness.    All other systems reviewed and are negative.  EKGs/Labs/Other Studies Reviewed:    The following studies were reviewed today: EKG reveals paced rhythm   Recent Labs: No results found for requested labs within last 8760 hours.  Recent Lipid Panel No results found for: CHOL, TRIG, HDL, CHOLHDL, VLDL, LDLCALC, LDLDIRECT  Physical Exam:    VS:  BP 130/78   Pulse 61   Ht 5\' 5"  (1.651 m)   Wt 154 lb (69.9 kg)   SpO2 98%   BMI 25.63 kg/m     Wt Readings from Last 3 Encounters:  09/11/20 154 lb (69.9 kg)  01/15/19 156 lb (70.8 kg)  09/26/17 127 lb 6.8 oz (57.8 kg)     GEN: Patient is in no acute  distress HEENT: Normal NECK: No JVD; No carotid bruits LYMPHATICS: No lymphadenopathy CARDIAC: Hear sounds regular, 2/6 systolic murmur at the apex. RESPIRATORY:  Clear to auscultation without rales, wheezing or rhonchi  ABDOMEN: Soft, non-tender, non-distended MUSCULOSKELETAL:  No edema; No deformity  SKIN: Warm and dry NEUROLOGIC:  Alert and oriented x 3 PSYCHIATRIC:  Normal affect   Signed, 09/28/17, MD  09/11/2020 12:39 PM    [facility]

## 2020-09-11 NOTE — Patient Instructions (Signed)

## 2020-11-04 ENCOUNTER — Encounter: Payer: Medicare Other | Admitting: Cardiology

## 2020-11-04 NOTE — Progress Notes (Deleted)
Electrophysiology Office Note   Date:  11/04/2020   ID:  Maria Cameron, DOB [birth year], MRN [id number]  PCP:  Galvin Proffer, MD  Cardiologist: Revankar Primary Electrophysiologist:  Maria Lafon Jorja Loa, MD    Chief Complaint: Pacemaker   History of Present Illness: Maria Cameron is a 87 y.o. female who is being seen today for the evaluation of pacemaker at the request of Revankar, Aundra Dubin, MD. Presenting today for electrophysiology evaluation.  She has a history significant for Alzheimer's disease, coronary artery disease, atrial fibrillation, hypertension, complete heart block status post Medtronic dual-chamber pacemaker, diabetes, sick sinus syndrome.  Today, she denies*** symptoms of palpitations, chest pain, shortness of breath, orthopnea, PND, lower extremity edema, claudication, dizziness, presyncope, syncope, bleeding, or neurologic sequela. The patient is tolerating medications without difficulties.    Past Medical History:  Diagnosis Date  . Abnormal gait 09/10/2020  . Altered mental status 09/10/2020  . Alzheimer's disease with late onset (HCC) 09/10/2020  . Anemia   . Atherosclerotic heart disease of native coronary artery without angina pectoris 09/10/2020  . CAD (coronary artery disease)   . Chest pain 03/12/2014  . Chronic atrial fibrillation, unspecified (HCC) 09/10/2020  . Cough 03/12/2014  . Dementia (HCC)   . Depression 09/25/2017  . Displaced comminuted fracture of shaft of right femur, initial encounter for closed fracture (HCC) 09/28/2017  . Dysphagia 09/10/2020  . Essential hypertension 09/10/2020  . Gastro-esophageal reflux disease without esophagitis 09/10/2020  . GERD (gastroesophageal reflux disease) 09/25/2017  . Heart block AV complete (HCC) 03/14/2014  . Hyperlipidemia   . Hypertension   . Hypertrophic osteoarthropathy 09/10/2020  . Hypothyroid 09/25/2017  . Hypothyroidism 09/10/2020  . Iron deficiency anemia 09/10/2020  . Irritable bowel syndrome 09/10/2020  . Long term  (current) use of anticoagulants 09/10/2020  . Longstanding persistent atrial fibrillation (HCC) 09/10/2020  . Macular degeneration   . Macular degeneration of both eyes 08/27/2020  . Major depression, single episode 09/10/2020  . Mild persistent asthma   . Mixed hyperlipidemia 09/10/2020  . Other vitamin B12 deficiency anemias 09/10/2020  . Peripheral vascular disease (HCC) 09/10/2020  . Polyneuropathy 09/10/2020  . Presence of cardiac pacemaker 06/18/2014  . Pulmonary embolism (HCC)    per [facility] ER records  . Pulmonary fibrosis (HCC)   . Recurrent falls 09/10/2020  . Residual hemorrhoidal skin tags 09/10/2020  . Rheumatoid arthritis (HCC)    per [facility] ER records  . Right femoral fracture (HCC) 09/25/2017  . Sick sinus syndrome (HCC)   . Symptomatic anemia 01/14/2019  . Syncope 11/23/2014  . Type 2 diabetes mellitus without complications (HCC) 09/10/2020  . Vitamin D deficiency 09/10/2020  . Warfarin-induced coagulopathy (HCC) 09/25/2017   Past Surgical History:  Procedure Laterality Date  . ABDOMINAL HYSTERECTOMY     per [facility] ER record  . CARDIAC CATHETERIZATION    . CHOLECYSTECTOMY     per [facility] ER record  . ORIF FEMUR FRACTURE Right 09/28/2017   Procedure: OPEN REDUCTION INTERNAL FIXATION FEMORAL SHAFT FRACTURE;  Surgeon: Myrene Galas, MD;  Location: [facility] OR;  Service: Orthopedics;  Laterality: Right;  . PACEMAKER PLACEMENT  03/14/2014   DDD PPM - Medtronic    . TONSILLECTOMY AND ADENOIDECTOMY       Current Outpatient Medications  Medication Sig Dispense Refill  . acetaminophen (TYLENOL) 325 MG tablet Take 650 mg by mouth every 6 (six) hours as needed for mild pain.    Marland Kitchen amLODipine (NORVASC) 10 MG tablet Take 10 mg by mouth daily.    Marland Kitchen  apixaban (ELIQUIS) 5 MG TABS tablet Take 5 mg by mouth 2 (two) times daily.    . ARIPiprazole (ABILIFY) 5 MG tablet Take 5 mg by mouth daily.    Marland Kitchen atorvastatin (LIPITOR) 20 MG tablet Take 20 mg by mouth at bedtime.    . carbamide peroxide (DEBROX)  6.5 % OTIC solution Place 10 drops into both ears at bedtime.    . Carboxymethylcellulose Sod PF 0.5 % SOLN Apply 1 drop to eye daily.    Marland Kitchen dicyclomine (BENTYL) 10 MG capsule Take 10 mg by mouth 2 times daily at 12 noon and 4 pm.     . escitalopram (LEXAPRO) 10 MG tablet Take 10 mg by mouth daily.    . fluticasone furoate-vilanterol (BREO ELLIPTA) 100-25 MCG/INH AEPB Inhale 1 puff into the lungs daily.    Marland Kitchen gabapentin (NEURONTIN) 300 MG capsule Take 300 mg by mouth 3 (three) times daily.    Marland Kitchen guaifenesin (ROBITUSSIN) 100 MG/5ML syrup Take 200 mg by mouth 4 (four) times daily as needed for cough.    . latanoprost (XALATAN) 0.005 % ophthalmic solution Place 1 drop into both eyes at bedtime.    Marland Kitchen levothyroxine (SYNTHROID, LEVOTHROID) 25 MCG tablet Take 25 mcg by mouth daily before breakfast.    . meloxicam (MOBIC) 15 MG tablet Take 15 mg by mouth daily.    . metoprolol tartrate (LOPRESSOR) 25 MG tablet Take 25 mg by mouth 2 (two) times daily.    . mirtazapine (REMERON) 15 MG tablet Take 15 mg by mouth at bedtime.     . pantoprazole (PROTONIX) 40 MG tablet Take 40 mg by mouth daily.     No current facility-administered medications for this visit.    Allergies:   Codeine, Coffee flavor, Green tea leaf ext, Chocolate flavor, Lemon flavor, and Lemon oil   Social History:  The patient  reports that she has never smoked. She has never used smokeless tobacco. She reports previous alcohol use.   Family History:  The patient's family history includes CAD in her brother and father.    ROS:  Please see the history of present illness.   Otherwise, review of systems is positive for none.   All other systems are reviewed and negative.    PHYSICAL EXAM: VS:  There were no vitals taken for this visit. , BMI There is no height or weight on file to calculate BMI. GEN: Well nourished, well developed, in no acute distress  HEENT: normal  Neck: no JVD, carotid bruits, or masses Cardiac: ***RRR; no murmurs,  rubs, or gallops,no edema  Respiratory:  clear to auscultation bilaterally, normal work of breathing GI: soft, nontender, nondistended, + BS MS: no deformity or atrophy  Skin: warm and dry, device pocket is well healed Neuro:  Strength and sensation are intact Psych: euthymic mood, full affect  EKG:  EKG {ACTION; IS/IS [id number]} ordered today. Personal review of the ekg ordered *** shows ***  Device interrogation is reviewed today in detail.  See PaceArt for details.   Recent Labs: No results found for requested labs within last 8760 hours.    Lipid Panel  No results found for: CHOL, TRIG, HDL, CHOLHDL, VLDL, LDLCALC, LDLDIRECT   Wt Readings from Last 3 Encounters:  09/11/20 154 lb (69.9 kg)  01/15/19 156 lb (70.8 kg)  09/26/17 127 lb 6.8 oz (57.8 kg)      Other studies Reviewed: Additional studies/ records that were reviewed today include: TTE 2020 Review of the above records today demonstrates:  EF 55 to 60% Impaired diastolic relaxation Right ventricle normal in size and function Left atrium normal by size and volume   ASSESSMENT AND PLAN:  1.  ***:  Status post Medtronic dual-chamber pacemaker.  Device functioning appropriately.  No changes at this time.  We Mavin Dyke arrange for follow-up in device clinic.  2.  Coronary artery disease: No current chest pain.  Plan per primary cardiology.  3.  Hyperlipidemia: Statin per primary care    Current medicines are reviewed at length with the patient today.   The patient does not have concerns regarding her medicines.  The following changes were made today:  {NONE DEFAULTED:18576::"none"}  Labs/ tests ordered today include:  No orders of the defined types were placed in this encounter.    Disposition:   FU with Kimoni Pagliarulo {gen number [id number]} {Days to years:10300}  Signed, Danija Gosa Jorja Loa, MD  11/04/2020 1:47 PM     [facility] [address] [place]  [address] [phone] (office) [phone] (fax)

## 2021-02-27 ENCOUNTER — Other Ambulatory Visit: Payer: Self-pay

## 2021-02-27 ENCOUNTER — Ambulatory Visit (INDEPENDENT_AMBULATORY_CARE_PROVIDER_SITE_OTHER): Payer: Medicare Other | Admitting: Podiatry

## 2021-02-27 ENCOUNTER — Encounter: Payer: Self-pay | Admitting: Podiatry

## 2021-02-27 DIAGNOSIS — E1151 Type 2 diabetes mellitus with diabetic peripheral angiopathy without gangrene: Secondary | ICD-10-CM

## 2021-02-27 DIAGNOSIS — B351 Tinea unguium: Secondary | ICD-10-CM

## 2021-02-27 DIAGNOSIS — E1169 Type 2 diabetes mellitus with other specified complication: Secondary | ICD-10-CM

## 2021-02-27 NOTE — Progress Notes (Signed)
  Subjective:  Patient ID: Maria Cameron, female    DOB: 11-15-1923,  MRN: [id number]  Chief Complaint  Patient presents with   Nail Problem    Trim nails     87 y.o. female presents with the above complaint. History confirmed with patient. States the nails are painful and ingrown.  Objective:  Physical Exam: warm, good capillary refill, nail exam onychomycosis of the toenails, no trophic changes or ulcerative lesions. DP pulses palpable and protective sensation intact. PT pulses absent. Left Foot: normal exam, no swelling, tenderness, instability; ligaments intact, full range of motion of all ankle/foot joints  Right Foot: normal exam, no swelling, tenderness, instability; ligaments intact, full range of motion of all ankle/foot joints   No images are attached to the encounter.  Assessment:   1. Onychomycosis of multiple toenails with type 2 diabetes mellitus and peripheral angiopathy (HCC)    Plan:  Patient was evaluated and treated and all questions answered.  Onychomycosis, Diabetes, and DPN -Patient is diabetic with a qualifying condition for at risk foot care.  Procedure: Nail Debridement Type of Debridement: manual, sharp debridement. Instrumentation: Nail nipper, rotary burr. Number of Nails: 10  No follow-ups on file.

## 2021-03-14 ENCOUNTER — Other Ambulatory Visit: Payer: Self-pay

## 2021-03-17 ENCOUNTER — Ambulatory Visit: Payer: Medicare Other | Admitting: Cardiology

## 2021-03-18 ENCOUNTER — Encounter: Payer: Self-pay | Admitting: Cardiology

## 2021-03-18 ENCOUNTER — Ambulatory Visit (INDEPENDENT_AMBULATORY_CARE_PROVIDER_SITE_OTHER): Payer: Medicare Other | Admitting: Cardiology

## 2021-03-18 ENCOUNTER — Other Ambulatory Visit: Payer: Self-pay

## 2021-03-18 VITALS — BP 130/60 | HR 62 | Ht 65.0 in | Wt 154.0 lb

## 2021-03-18 DIAGNOSIS — I2510 Atherosclerotic heart disease of native coronary artery without angina pectoris: Secondary | ICD-10-CM

## 2021-03-18 DIAGNOSIS — I442 Atrioventricular block, complete: Secondary | ICD-10-CM | POA: Diagnosis not present

## 2021-03-18 DIAGNOSIS — I10 Essential (primary) hypertension: Secondary | ICD-10-CM

## 2021-03-18 DIAGNOSIS — E782 Mixed hyperlipidemia: Secondary | ICD-10-CM | POA: Diagnosis not present

## 2021-03-18 DIAGNOSIS — Z950 Presence of cardiac pacemaker: Secondary | ICD-10-CM

## 2021-03-18 NOTE — Progress Notes (Signed)
Cardiology Office Note:    Date:  03/18/2021   ID:  Maria Cameron, DOB 1923/07/29, MRN [id number]  PCP:  Galvin Proffer, MD  Cardiologist:  Garwin Brothers, MD   Referring MD: Galvin Proffer, MD    ASSESSMENT:    1. Atherosclerosis of native coronary artery of native heart without angina pectoris   2. Essential hypertension   3. Heart block AV complete (HCC)   4. Mixed hyperlipidemia   5. Presence of cardiac pacemaker    PLAN:    In order of problems listed above:  Coronary artery disease: Secondary prevention stressed with the patient.  Importance of compliance with diet medication stressed. Essential hypertension: Blood pressure stable.  Diet was emphasized. History of pulmonary embolism: On anticoagulation.  She has not had a fall.  She is well assisted by the people in the facility for her ADLs.  This aspect of her care is managed by primary care. Post permanent pacemaker for complete heart block: Followed by electrophysiology colleagues and stable. Patient will be seen in follow-up appointment in 6 months or earlier if the patient has any concerns    Medication Adjustments/Labs and Tests Ordered: Current medicines are reviewed at length with the patient today.  Concerns regarding medicines are outlined above.  No orders of the defined types were placed in this encounter.  No orders of the defined types were placed in this encounter.    No chief complaint on file.    History of Present Illness:    Maria Cameron is a 87 y.o. female.  Patient has past medical history of coronary artery disease, complete heart block post permanent pacemaker pulmonary embolism.  She denies any problems at this time and lives in an assisted living facility.  Essentially she is very sedentary and is helped for activities of daily living.  At the time of my evaluation, the patient is alert awake oriented and in no distress.  Past Medical History:  Diagnosis Date   Abnormal gait 09/10/2020    Altered mental status 09/10/2020   Alzheimer's disease with late onset (HCC) 09/10/2020   Anemia    Atherosclerotic heart disease of native coronary artery without angina pectoris 09/10/2020   CAD (coronary artery disease)    Chest pain 03/12/2014   Chronic atrial fibrillation, unspecified (HCC) 09/10/2020   Cough 03/12/2014   Dementia (HCC)    Depression 09/25/2017   Displaced comminuted fracture of shaft of right femur, initial encounter for closed fracture (HCC) 09/28/2017   Dysphagia 09/10/2020   Essential hypertension 09/10/2020   Gastro-esophageal reflux disease without esophagitis 09/10/2020   GERD (gastroesophageal reflux disease) 09/25/2017   Heart block AV complete (HCC) 03/14/2014   History of pulmonary embolism 09/11/2020   Hyperlipidemia    Hypertension    Hypertrophic osteoarthropathy 09/10/2020   Hypothyroid 09/25/2017   Hypothyroidism 09/10/2020   Iron deficiency anemia 09/10/2020   Irritable bowel syndrome 09/10/2020   Long term (current) use of anticoagulants 09/10/2020   Longstanding persistent atrial fibrillation (HCC) 09/10/2020   Macular degeneration    Macular degeneration of both eyes 08/27/2020   Major depression, single episode 09/10/2020   Mild persistent asthma    Mixed hyperlipidemia 09/10/2020   Other vitamin B12 deficiency anemias 09/10/2020   Peripheral vascular disease (HCC) 09/10/2020   Polyneuropathy 09/10/2020   Presence of cardiac pacemaker 06/18/2014   Pulmonary embolism (HCC)    per [facility] ER records   Pulmonary fibrosis (HCC)    Recurrent falls 09/10/2020   Residual hemorrhoidal  skin tags 09/10/2020   Rheumatoid arthritis (HCC)    per [facility] ER records   Right femoral fracture (HCC) 09/25/2017   Sick sinus syndrome (HCC)    Symptomatic anemia 01/14/2019   Syncope 11/23/2014   Type 2 diabetes mellitus without complications (HCC) 09/10/2020   Vitamin D deficiency 09/10/2020   Warfarin-induced coagulopathy (HCC) 09/25/2017    Past Surgical History:  Procedure Laterality Date    ABDOMINAL HYSTERECTOMY     per [facility] ER record   CARDIAC CATHETERIZATION     CHOLECYSTECTOMY     per [facility] ER record   ORIF FEMUR FRACTURE Right 09/28/2017   Procedure: OPEN REDUCTION INTERNAL FIXATION FEMORAL SHAFT FRACTURE;  Surgeon: Myrene Galas, MD;  Location: [facility] OR;  Service: Orthopedics;  Laterality: Right;   PACEMAKER PLACEMENT  03/14/2014   DDD PPM - Medtronic     TONSILLECTOMY AND ADENOIDECTOMY      Current Medications: Current Meds  Medication Sig   acetaminophen (TYLENOL) 325 MG tablet Take 650 mg by mouth every 6 (six) hours as needed for mild pain.   amLODipine (NORVASC) 10 MG tablet Take 10 mg by mouth daily.   apixaban (ELIQUIS) 5 MG TABS tablet Take 5 mg by mouth 2 (two) times daily.   ARIPiprazole (ABILIFY) 5 MG tablet Take 5 mg by mouth daily.   atorvastatin (LIPITOR) 20 MG tablet Take 20 mg by mouth at bedtime.   carbamide peroxide (DEBROX) 6.5 % OTIC solution Place 10 drops into both ears at bedtime.   Carboxymethylcellulose Sod PF 0.5 % SOLN Apply 1 drop to eye daily.   dicyclomine (BENTYL) 10 MG capsule Take 10 mg by mouth 2 times daily at 12 noon and 4 pm.    escitalopram (LEXAPRO) 10 MG tablet Take 10 mg by mouth daily.   fluticasone furoate-vilanterol (BREO ELLIPTA) 100-25 MCG/INH AEPB Inhale 1 puff into the lungs daily.   gabapentin (NEURONTIN) 300 MG capsule Take 300 mg by mouth 3 (three) times daily.   guaifenesin (ROBITUSSIN) 100 MG/5ML syrup Take 200 mg by mouth 4 (four) times daily as needed for cough.   Ipratropium-Albuterol (ALBUTEROL-IPRATROPIUM IN) Inhale 1-2 puffs into the lungs as needed for wheezing or shortness of breath.   latanoprost (XALATAN) 0.005 % ophthalmic solution Place 1 drop into both eyes at bedtime.   levothyroxine (SYNTHROID, LEVOTHROID) 25 MCG tablet Take 25 mcg by mouth daily before breakfast.   meloxicam (MOBIC) 15 MG tablet Take 15 mg by mouth daily.   metoprolol tartrate (LOPRESSOR) 25 MG tablet Take 25 mg by mouth 2  (two) times daily.   mirtazapine (REMERON) 15 MG tablet Take 15 mg by mouth at bedtime.    pantoprazole (PROTONIX) 40 MG tablet Take 40 mg by mouth daily.   polyethylene glycol (MIRALAX / GLYCOLAX) 17 g packet Take 17 g by mouth daily.     Allergies:   Codeine, Coffee flavor, Green tea leaf ext, Chocolate flavor, Lemon flavor, and Lemon oil   Social History   Socioeconomic History   Marital status: Widowed    Spouse name: Not on file   Number of children: Not on file   Years of education: Not on file   Highest education level: Not on file  Occupational History   Not on file  Tobacco Use   Smoking status: Never   Smokeless tobacco: Never  Substance and Sexual Activity   Alcohol use: Not Currently   Drug use: Not on file   Sexual activity: Not on file  Other Topics  Concern   Not on file  Social History Narrative   Not on file   Social Determinants of Health   Financial Resource Strain: Not on file  Food Insecurity: Not on file  Transportation Needs: Not on file  Physical Activity: Not on file  Stress: Not on file  Social Connections: Not on file     Family History: The patient's family history includes CAD in her brother and father.  ROS:   Please see the history of present illness.    All other systems reviewed and are negative.  EKGs/Labs/Other Studies Reviewed:    The following studies were reviewed today: I discussed my findings with the patient at length.   Recent Labs: No results found for requested labs within last 8760 hours.  Recent Lipid Panel No results found for: CHOL, TRIG, HDL, CHOLHDL, VLDL, LDLCALC, LDLDIRECT  Physical Exam:    VS:  BP 130/60   Pulse 62   Ht 5\' 5"  (1.651 m)   Wt 154 lb (69.9 kg)   SpO2 94%   BMI 25.63 kg/m     Wt Readings from Last 3 Encounters:  03/18/21 154 lb (69.9 kg)  09/11/20 154 lb (69.9 kg)  01/15/19 156 lb (70.8 kg)     GEN: Patient is in no acute distress HEENT: Normal NECK: No JVD; No carotid  bruits LYMPHATICS: No lymphadenopathy CARDIAC: Hear sounds regular, 2/6 systolic murmur at the apex. RESPIRATORY:  Clear to auscultation without rales, wheezing or rhonchi  ABDOMEN: Soft, non-tender, non-distended MUSCULOSKELETAL:  No edema; No deformity  SKIN: Warm and dry NEUROLOGIC:  Alert and oriented x 3 PSYCHIATRIC:  Normal affect   Signed, 03/18/19, MD  03/18/2021 11:26 AM    [facility]

## 2021-03-18 NOTE — Patient Instructions (Signed)

## 2021-09-16 ENCOUNTER — Encounter: Payer: Self-pay | Admitting: Cardiology

## 2021-09-16 ENCOUNTER — Other Ambulatory Visit: Payer: Self-pay

## 2021-09-16 ENCOUNTER — Ambulatory Visit (INDEPENDENT_AMBULATORY_CARE_PROVIDER_SITE_OTHER): Payer: Medicare Other | Admitting: Cardiology

## 2021-09-16 VITALS — BP 120/66 | HR 65 | Ht 65.0 in | Wt 142.0 lb

## 2021-09-16 DIAGNOSIS — Z950 Presence of cardiac pacemaker: Secondary | ICD-10-CM

## 2021-09-16 DIAGNOSIS — I10 Essential (primary) hypertension: Secondary | ICD-10-CM

## 2021-09-16 DIAGNOSIS — I2510 Atherosclerotic heart disease of native coronary artery without angina pectoris: Secondary | ICD-10-CM | POA: Diagnosis not present

## 2021-09-16 DIAGNOSIS — F411 Generalized anxiety disorder: Secondary | ICD-10-CM | POA: Insufficient documentation

## 2021-09-16 DIAGNOSIS — I442 Atrioventricular block, complete: Secondary | ICD-10-CM

## 2021-09-16 DIAGNOSIS — E782 Mixed hyperlipidemia: Secondary | ICD-10-CM | POA: Diagnosis not present

## 2021-09-16 DIAGNOSIS — L89626 Pressure-induced deep tissue damage of left heel: Secondary | ICD-10-CM | POA: Insufficient documentation

## 2021-09-16 DIAGNOSIS — M150 Primary generalized (osteo)arthritis: Secondary | ICD-10-CM | POA: Insufficient documentation

## 2021-09-16 DIAGNOSIS — Z86711 Personal history of pulmonary embolism: Secondary | ICD-10-CM

## 2021-09-16 HISTORY — DX: Pressure-induced deep tissue damage of left heel: L89.626

## 2021-09-16 HISTORY — DX: Generalized anxiety disorder: F41.1

## 2021-09-16 HISTORY — DX: Primary generalized (osteo)arthritis: M15.0

## 2021-09-16 NOTE — Patient Instructions (Signed)

## 2021-09-16 NOTE — Progress Notes (Signed)
?Cardiology Office Note:   ? ?Date:  09/16/2021  ? ?ID:  Maria Cameron, DOB August 19, 1923, MRN [id number] ? ?PCP:  Galvin ProfferHague, Imran P, MD  ?Cardiologist:  Garwin Brothersajan R Syona Wroblewski, MD  ? ?Referring MD: Galvin ProfferHague, Imran P, MD  ? ? ?ASSESSMENT:   ? ?1. Mixed hyperlipidemia   ?2. Atherosclerosis of native coronary artery of native heart without angina pectoris   ?3. Heart block AV complete (HCC)   ?4. Essential hypertension   ?5. History of pulmonary embolism   ?6. Presence of cardiac pacemaker   ? ?PLAN:   ? ?In order of problems listed above: ? ?Coronary artery disease: Secondary prevention stressed.  Medication compliance urged. ?History of atrial fibrillation and pulmonary embolism: On anticoagulation. ?History of dyslipidemia: On statins and managed by primary care. ?Permanent pacemaker: We will ensure that our electrophysiology colleagues are in the loop for that evaluation and monitoring. ?Patient will be seen in follow-up appointment in 6 months or earlier if the patient has any concerns ? ? ? ?Medication Adjustments/Labs and Tests Ordered: ?Current medicines are reviewed at length with the patient today.  Concerns regarding medicines are outlined above.  ?No orders of the defined types were placed in this encounter. ? ?No orders of the defined types were placed in this encounter. ? ? ? ?No chief complaint on file. ?  ? ?History of Present Illness:   ? ?Maria Cameron is a 88 y.o. female.  Patient has past medical history of complete heart block for which she has had a pacemaker.  She is brought in from the facility by attending attendant.  She denies any problems at this time.  She is not much ambulatory and is brought in in a wheelchair. ?Past Medical History:  ?Diagnosis Date  ? Abnormal gait 09/10/2020  ? Altered mental status 09/10/2020  ? Alzheimer's disease with late onset (HCC) 09/10/2020  ? Anemia   ? Atherosclerotic heart disease of native coronary artery without angina pectoris 09/10/2020  ? CAD (coronary artery disease)   ? Chest  pain 03/12/2014  ? Chronic atrial fibrillation, unspecified (HCC) 09/10/2020  ? Cough 03/12/2014  ? Dementia (HCC)   ? Depression 09/25/2017  ? Displaced comminuted fracture of shaft of right femur, initial encounter for closed fracture (HCC) 09/28/2017  ? Dysphagia 09/10/2020  ? Essential hypertension 09/10/2020  ? Gastro-esophageal reflux disease without esophagitis 09/10/2020  ? GERD (gastroesophageal reflux disease) 09/25/2017  ? Heart block AV complete (HCC) 03/14/2014  ? History of pulmonary embolism 09/11/2020  ? Hyperlipidemia   ? Hypertension   ? Hypertrophic osteoarthropathy 09/10/2020  ? Hypothyroid 09/25/2017  ? Hypothyroidism 09/10/2020  ? Iron deficiency anemia 09/10/2020  ? Irritable bowel syndrome 09/10/2020  ? Long term (current) use of anticoagulants 09/10/2020  ? Longstanding persistent atrial fibrillation (HCC) 09/10/2020  ? Macular degeneration   ? Macular degeneration of both eyes 08/27/2020  ? Major depression, single episode 09/10/2020  ? Mild persistent asthma   ? Mixed hyperlipidemia 09/10/2020  ? Other vitamin B12 deficiency anemias 09/10/2020  ? Peripheral vascular disease (HCC) 09/10/2020  ? Polyneuropathy 09/10/2020  ? Presence of cardiac pacemaker 06/18/2014  ? Pulmonary embolism (HCC)   ? [facility]  ? Pulmonary fibrosis (HCC)   ? Recurrent falls 09/10/2020  ? Residual hemorrhoidal skin tags 09/10/2020  ? Rheumatoid arthritis (HCC)   ? [facility]  ? Right femoral fracture (HCC) 09/25/2017  ? Sick sinus syndrome (HCC)   ? Symptomatic anemia 01/14/2019  ? Syncope 11/23/2014  ?  Type 2 diabetes mellitus without complications (HCC) 09/10/2020  ? Vitamin D deficiency 09/10/2020  ? Warfarin-induced coagulopathy (HCC) 09/25/2017  ? ? ?Past Surgical History:  ?Procedure Laterality Date  ? ABDOMINAL HYSTERECTOMY    ? per [facility] ER record  ? CARDIAC CATHETERIZATION    ? CHOLECYSTECTOMY    ? per [facility] ER record  ? ORIF FEMUR FRACTURE Right 09/28/2017  ? Procedure: OPEN REDUCTION INTERNAL FIXATION FEMORAL SHAFT FRACTURE;   Surgeon: Myrene Galas, MD;  Location: [facility] OR;  Service: Orthopedics;  Laterality: Right;  ? PACEMAKER PLACEMENT  03/14/2014  ? DDD PPM - Medtronic    ? TONSILLECTOMY AND ADENOIDECTOMY    ? ? ?Current Medications: ?No outpatient medications have been marked as taking for the 09/16/21 encounter (Office Visit) with Breyon Sigg, Aundra Dubin, MD.  ?  ? ?Allergies:   Codeine, Coffee flavor, Green tea leaf ext, Chocolate flavor, Lemon flavor, and Lemon oil  ? ?Social History  ? ?Socioeconomic History  ? Marital status: Widowed  ?  Spouse name: Not on file  ? Number of children: Not on file  ? Years of education: Not on file  ? Highest education level: Not on file  ?Occupational History  ? Not on file  ?Tobacco Use  ? Smoking status: Never  ? Smokeless tobacco: Never  ?Substance and Sexual Activity  ? Alcohol use: Not Currently  ? Drug use: Not on file  ? Sexual activity: Not on file  ?Other Topics Concern  ? Not on file  ?Social History Narrative  ? Not on file  ? ?Social Determinants of Health  ? ?Financial Resource Strain: Not on file  ?Food Insecurity: Not on file  ?Transportation Needs: Not on file  ?Physical Activity: Not on file  ?Stress: Not on file  ?Social Connections: Not on file  ?  ? ?Family History: ?The patient's family history includes CAD in her brother and father. ? ?ROS:   ?Please see the history of present illness.    ?All other systems reviewed and are negative. ? ?EKGs/Labs/Other Studies Reviewed:   ? ?The following studies were reviewed today: ?I discussed my findings with the patient at length.  EKG reveals atrial sensing and ventricular pacing. ? ? ?Recent Labs: ?No results found for requested labs within last 8760 hours.  ?Recent Lipid Panel ?No results found for: CHOL, TRIG, HDL, CHOLHDL, VLDL, LDLCALC, LDLDIRECT ? ?Physical Exam:   ? ?VS:  There were no vitals taken for this visit.   ? ?Wt Readings from Last 3 Encounters:  ?03/18/21 154 lb (69.9 kg)  ?09/11/20 154 lb (69.9 kg)  ?01/15/19 156 lb (70.8  kg)  ?  ? ?GEN: Patient is in no acute distress ?HEENT: Normal ?NECK: No JVD; No carotid bruits ?LYMPHATICS: No lymphadenopathy ?CARDIAC: Hear sounds regular, 2/6 systolic murmur at the apex. ?RESPIRATORY:  Clear to auscultation without rales, wheezing or rhonchi  ?ABDOMEN: Soft, non-tender, non-distended ?MUSCULOSKELETAL:  No edema; No deformity  ?SKIN: Warm and dry ?NEUROLOGIC:  Alert and oriented x 3 ?PSYCHIATRIC:  Normal affect  ? ?Signed, ?Garwin Brothers, MD  ?09/16/2021 11:08 AM    ?[facility]  ?

## 2021-10-09 ENCOUNTER — Encounter: Payer: Medicare Other | Admitting: Cardiology

## 2021-10-09 NOTE — Progress Notes (Incomplete)
? ?Electrophysiology Office Note ? ? ?Date:  10/09/2021  ? ?ID:  Maria Cameron, DOB [birth year], MRN [id number] ? ?PCP:  Galvin ProfferHague, Imran P, MD  ?Cardiologist:  *** ?Primary Electrophysiologist: *** Jeanmarie Mccowen Jorja LoaMartin Sanjna Haskew, MD   ? ?Chief Complaint: *** ?  ?History of Present Illness: ?Maria Cameron is a 88 y.o. female who is being seen today for the evaluation of *** at the request of Revankar, Aundra Dubinajan R, MD. Presenting today for electrophysiology evaluation. ? ? ? ?Today, she denies*** symptoms of palpitations, chest pain, shortness of breath, orthopnea, PND, lower extremity edema, claudication, dizziness, presyncope, syncope, bleeding, or neurologic sequela. The patient is tolerating medications without difficulties.  ? ? ?Past Medical History:  ?Diagnosis Date  ? Abnormal gait 09/10/2020  ? Altered mental status 09/10/2020  ? Alzheimer's disease with late onset (HCC) 09/10/2020  ? Anemia   ? Atherosclerotic heart disease of native coronary artery without angina pectoris 09/10/2020  ? CAD (coronary artery disease)   ? Chest pain 03/12/2014  ? Chronic atrial fibrillation, unspecified (HCC) 09/10/2020  ? Cough 03/12/2014  ? Dementia (HCC)   ? Depression 09/25/2017  ? Displaced comminuted fracture of shaft of right femur, initial encounter for closed fracture (HCC) 09/28/2017  ? Dysphagia 09/10/2020  ? Essential hypertension 09/10/2020  ? Gastro-esophageal reflux disease without esophagitis 09/10/2020  ? GERD (gastroesophageal reflux disease) 09/25/2017  ? Heart block AV complete (HCC) 03/14/2014  ? History of pulmonary embolism 09/11/2020  ? Hyperlipidemia   ? Hypertension   ? Hypertrophic osteoarthropathy 09/10/2020  ? Hypothyroid 09/25/2017  ? Hypothyroidism 09/10/2020  ? Iron deficiency anemia 09/10/2020  ? Irritable bowel syndrome 09/10/2020  ? Long term (current) use of anticoagulants 09/10/2020  ? Longstanding persistent atrial fibrillation (HCC) 09/10/2020  ? Macular degeneration   ? Macular degeneration of both eyes 08/27/2020  ? Major depression, single episode  09/10/2020  ? Mild persistent asthma   ? Mixed hyperlipidemia 09/10/2020  ? Other vitamin B12 deficiency anemias 09/10/2020  ? Peripheral vascular disease (HCC) 09/10/2020  ? Polyneuropathy 09/10/2020  ? Presence of cardiac pacemaker 06/18/2014  ? Pulmonary embolism (HCC)   ? [facility]  ? Pulmonary fibrosis (HCC)   ? Recurrent falls 09/10/2020  ? Residual hemorrhoidal skin tags 09/10/2020  ? Rheumatoid arthritis (HCC)   ? [facility]  ? Right femoral fracture (HCC) 09/25/2017  ? Sick sinus syndrome (HCC)   ? Symptomatic anemia 01/14/2019  ? Syncope 11/23/2014  ? Type 2 diabetes mellitus without complications (HCC) 09/10/2020  ? Vitamin D deficiency 09/10/2020  ? Warfarin-induced coagulopathy (HCC) 09/25/2017  ? ?Past Surgical History:  ?Procedure Laterality Date  ? ABDOMINAL HYSTERECTOMY    ? [facility]  ? CARDIAC CATHETERIZATION    ? CHOLECYSTECTOMY    ? [facility]  ? ORIF FEMUR FRACTURE Right 09/28/2017  ? Procedure: OPEN REDUCTION INTERNAL FIXATION FEMORAL SHAFT FRACTURE;  Surgeon: Myrene GalasHandy, Michael, MD;  Location: [facility] OR;  Service: Orthopedics;  Laterality: Right;  ? PACEMAKER PLACEMENT  03/14/2014  ? DDD PPM - Medtronic    ? TONSILLECTOMY AND ADENOIDECTOMY    ? ? ? ?Current Outpatient Medications  ?Medication Sig Dispense Refill  ? acetaminophen (TYLENOL) 325 MG tablet Take 650 mg by mouth every 6 (six) hours as needed for mild pain.    ? apixaban (ELIQUIS) 5 MG TABS tablet Take 5 mg by mouth 2 (two) times daily.    ? busPIRone (BUSPAR) 5 MG tablet Take 5 mg by mouth  daily.    ? Carboxymethylcellulose Sod PF 0.5 % SOLN Apply 1 drop to eye daily.    ? dicyclomine (BENTYL) 10 MG capsule Take 10 mg by mouth 2 times daily at 12 noon and 4 pm.     ? escitalopram (LEXAPRO) 10 MG tablet Take 10 mg by mouth daily.    ? fluconazole (DIFLUCAN) 150 MG tablet Take 150 mg by mouth daily.    ? fluticasone furoate-vilanterol (BREO ELLIPTA) 100-25 MCG/INH AEPB Inhale 1 puff into the lungs daily.    ?  gabapentin (NEURONTIN) 300 MG capsule Take 300 mg by mouth 3 (three) times daily.    ? guaifenesin (ROBITUSSIN) 100 MG/5ML syrup Take 200 mg by mouth 4 (four) times daily as needed for cough.    ? HYDROcodone-acetaminophen (NORCO/VICODIN) 5-325 MG tablet Take 1 tablet by mouth as needed for moderate pain.    ? ipratropium-albuterol (DUONEB) 0.5-2.5 (3) MG/3ML SOLN Take 3 mLs by nebulization every 6 (six) hours.    ? latanoprost (XALATAN) 0.005 % ophthalmic solution Place 1 drop into both eyes at bedtime.    ? levothyroxine (SYNTHROID, LEVOTHROID) 25 MCG tablet Take 25 mcg by mouth daily before breakfast.    ? magnesium oxide (MAG-OX) 400 MG tablet Take 400 mg by mouth 2 (two) times daily.    ? meloxicam (MOBIC) 15 MG tablet Take 15 mg by mouth daily.    ? metoprolol tartrate (LOPRESSOR) 25 MG tablet Take 25 mg by mouth 2 (two) times daily.    ? mirtazapine (REMERON) 15 MG tablet Take 15 mg by mouth at bedtime.     ? pantoprazole (PROTONIX) 40 MG tablet Take 40 mg by mouth daily.    ? ?No current facility-administered medications for this visit.  ? ? ?Allergies:   Codeine, Coffee flavor, Green tea leaf ext, Chocolate flavor, Lemon flavor, and Lemon oil  ? ?Social History:  The patient  reports that she has never smoked. She has never used smokeless tobacco. She reports that she does not currently use alcohol.  ? ?Family History:  The patient's ***family history includes CAD in her brother and father.  ? ? ?ROS:  Please see the history of present illness.   Otherwise, review of systems is positive for none.   All other systems are reviewed and negative.  ? ? ?PHYSICAL EXAM: ?VS:  There were no vitals taken for this visit. , BMI There is no height or weight on file to calculate BMI. ?GEN: Well nourished, well developed, in no acute distress  ?HEENT: normal  ?Neck: no JVD, carotid bruits, or masses ?Cardiac: ***RRR; no murmurs, rubs, or gallops,no edema  ?Respiratory:  clear to auscultation bilaterally, normal work of  breathing ?GI: soft, nontender, nondistended, + BS ?MS: no deformity or atrophy  ?Skin: warm and dry, ***device pocket is well healed ?Neuro:  Strength and sensation are intact ?Psych: euthymic mood, full affect ? ?EKG:  EKG {ACTION; IS/IS ONG:29528413} ordered today. ?Personal review of the ekg ordered *** shows *** ? ?***Device interrogation is reviewed today in detail.  See PaceArt for details. ? ? ?Recent Labs: ?No results found for requested labs within last 8760 hours.  ? ? ?Lipid Panel  ?No results found for: CHOL, TRIG, HDL, CHOLHDL, VLDL, LDLCALC, LDLDIRECT ? ? ?Wt Readings from Last 3 Encounters:  ?09/16/21 142 lb (64.4 kg)  ?03/18/21 154 lb (69.9 kg)  ?09/11/20 154 lb (69.9 kg)  ?  ? ? ?Other studies Reviewed: ?Additional studies/ records that were reviewed today  include: TTE 2020  ?Review of the above records today demonstrates:  ?Action fraction 55 to 60% ?Impaired diastolic relaxation ?Right ventricle normal in size and function ?Left ventricle normal in size by volume ? ? ?ASSESSMENT AND PLAN: ? ?1.  *** ? ? ? ?Current medicines are reviewed at length with the patient today.   ?The patient {ACTIONS; HAS/DOES NOT HAVE:19233} concerns regarding her medicines.  The following changes were made today:  {NONE DEFAULTED:18576} ? ?Labs/ tests ordered today include: *** ?No orders of the defined types were placed in this encounter. ? ? ? ?Disposition:   FU with Eilish Mcdaniel {gen number [id number]} {Days to years:10300} ? ?Signed, ?Ephriam Turman Jorja Loa, MD  ?10/09/2021 2:23 PM    ? ?[facility] ?[address] ?[address] ?[place] [address] ?[phone] (office) ?[phone]) ? ?

## 2021-12-02 ENCOUNTER — Encounter: Payer: Medicare Other | Admitting: Cardiology

## 2021-12-08 ENCOUNTER — Encounter: Payer: Self-pay | Admitting: Cardiology

## 2021-12-08 ENCOUNTER — Ambulatory Visit (INDEPENDENT_AMBULATORY_CARE_PROVIDER_SITE_OTHER): Payer: Medicare Other | Admitting: Cardiology

## 2021-12-08 VITALS — BP 116/56 | HR 52 | Ht 64.0 in

## 2021-12-08 DIAGNOSIS — I442 Atrioventricular block, complete: Secondary | ICD-10-CM

## 2021-12-08 NOTE — Progress Notes (Signed)
Electrophysiology Office Note   Date:  12/08/2021   ID:  Maria Cameron, DOB Sep 19, 1923, MRN [id number]  PCP:  Galvin Proffer, MD  Cardiologist: Revankar Primary Electrophysiologist:  Sharne Linders Jorja Loa, MD    Chief Complaint: Pacemaker   History of Present Illness: Maria Cameron is a 88 y.o. female who is being seen today for the evaluation of pacemaker at the request of Revankar, Aundra Dubin, MD. Presenting today for electrophysiology evaluation.  She has a history significant for Alzheimer's disease, coronary artery disease, complete heart block status post Medtronic pacemaker, atrial fibrillation.  She presents for initial therapy for her pacemaker implant.  She has no complaints today.  She is in her baseline state of health.  Today, she denies symptoms of palpitations, chest pain, shortness of breath, orthopnea, PND, lower extremity edema, claudication, dizziness, presyncope, syncope, bleeding, or neurologic sequela. The patient is tolerating medications without difficulties.    Past Medical History:  Diagnosis Date   Abnormal gait 09/10/2020   Altered mental status 09/10/2020   Alzheimer's disease with late onset (HCC) 09/10/2020   Anemia    Atherosclerotic heart disease of native coronary artery without angina pectoris 09/10/2020   CAD (coronary artery disease)    Chest pain 03/12/2014   Chronic atrial fibrillation, unspecified (HCC) 09/10/2020   Cough 03/12/2014   Dementia (HCC)    Depression 09/25/2017   Displaced comminuted fracture of shaft of right femur, initial encounter for closed fracture (HCC) 09/28/2017   Dysphagia 09/10/2020   Essential hypertension 09/10/2020   Gastro-esophageal reflux disease without esophagitis 09/10/2020   GERD (gastroesophageal reflux disease) 09/25/2017   Heart block AV complete (HCC) 03/14/2014   History of pulmonary embolism 09/11/2020   Hyperlipidemia    Hypertension    Hypertrophic osteoarthropathy 09/10/2020   Hypothyroid 09/25/2017   Hypothyroidism 09/10/2020    Iron deficiency anemia 09/10/2020   Irritable bowel syndrome 09/10/2020   Long term (current) use of anticoagulants 09/10/2020   Longstanding persistent atrial fibrillation (HCC) 09/10/2020   Macular degeneration    Macular degeneration of both eyes 08/27/2020   Major depression, single episode 09/10/2020   Mild persistent asthma    Mixed hyperlipidemia 09/10/2020   Other vitamin B12 deficiency anemias 09/10/2020   Peripheral vascular disease (HCC) 09/10/2020   Polyneuropathy 09/10/2020   Presence of cardiac pacemaker 06/18/2014   Pulmonary embolism (HCC)    per [facility] ER records   Pulmonary fibrosis (HCC)    Recurrent falls 09/10/2020   Residual hemorrhoidal skin tags 09/10/2020   Rheumatoid arthritis (HCC)    per [facility] ER records   Right femoral fracture (HCC) 09/25/2017   Sick sinus syndrome (HCC)    Symptomatic anemia 01/14/2019   Syncope 11/23/2014   Type 2 diabetes mellitus without complications (HCC) 09/10/2020   Vitamin D deficiency 09/10/2020   Warfarin-induced coagulopathy (HCC) 09/25/2017   Past Surgical History:  Procedure Laterality Date   ABDOMINAL HYSTERECTOMY     per [facility] ER record   CARDIAC CATHETERIZATION     CHOLECYSTECTOMY     per [place] [facility] ER record   ORIF FEMUR FRACTURE Right 09/28/2017   Procedure: OPEN REDUCTION INTERNAL FIXATION FEMORAL SHAFT FRACTURE;  Surgeon: Myrene Galas, MD;  Location: [facility] OR;  Service: Orthopedics;  Laterality: Right;   PACEMAKER PLACEMENT  03/14/2014   DDD PPM - Medtronic     TONSILLECTOMY AND ADENOIDECTOMY       Current Outpatient Medications  Medication Sig Dispense Refill   acetaminophen (TYLENOL) 325 MG tablet Take 650 mg by  mouth every 6 (six) hours as needed for mild pain.     amLODipine (NORVASC) 10 MG tablet Take 10 mg by mouth daily.     apixaban (ELIQUIS) 5 MG TABS tablet Take 5 mg by mouth 2 (two) times daily.     ARIPiprazole (ABILIFY) 5 MG tablet Take 5 mg by mouth daily.     atorvastatin (LIPITOR) 20 MG tablet Take 20 mg by  mouth at bedtime.     busPIRone (BUSPAR) 5 MG tablet Take 2.5 mg by mouth daily.     Carboxymethylcellulose Sod PF 0.5 % SOLN Apply 1 drop to eye daily.     dicyclomine (BENTYL) 10 MG capsule Take 10 mg by mouth 2 times daily at 12 noon and 4 pm.      escitalopram (LEXAPRO) 10 MG tablet Take 10 mg by mouth daily.     fluticasone furoate-vilanterol (BREO ELLIPTA) 100-25 MCG/INH AEPB Inhale 1 puff into the lungs daily.     gabapentin (NEURONTIN) 300 MG capsule Take 300 mg by mouth 3 (three) times daily.     guaifenesin (ROBITUSSIN) 100 MG/5ML syrup Take 200 mg by mouth 4 (four) times daily as needed for cough.     ipratropium-albuterol (DUONEB) 0.5-2.5 (3) MG/3ML SOLN Take 3 mLs by nebulization every 6 (six) hours.     latanoprost (XALATAN) 0.005 % ophthalmic solution Place 1 drop into both eyes at bedtime.     levothyroxine (SYNTHROID, LEVOTHROID) 25 MCG tablet Take 25 mcg by mouth daily before breakfast.     magnesium oxide (MAG-OX) 400 MG tablet Take 400 mg by mouth 2 (two) times daily.     meloxicam (MOBIC) 15 MG tablet Take 15 mg by mouth daily.     metoprolol tartrate (LOPRESSOR) 25 MG tablet Take 25 mg by mouth 2 (two) times daily.     pantoprazole (PROTONIX) 40 MG tablet Take 40 mg by mouth daily.     No current facility-administered medications for this visit.    Allergies:   Codeine, Coffee flavor, Green tea leaf ext, Chocolate flavor, Lemon flavor, and Lemon oil   Social History:  The patient  reports that she has never smoked. She has never used smokeless tobacco. She reports that she does not currently use alcohol.   Family History:  The patient's family history includes CAD in her brother and father.    ROS:  Please see the history of present illness.   Otherwise, review of systems is positive for none.   All other systems are reviewed and negative.    PHYSICAL EXAM: VS:  BP (!) 116/56   Pulse (!) 52   Ht  (1.626 m)   SpO2 93%   BMI 24.37 kg/m  , BMI Body mass  index is 24.37 kg/m. GEN: Well nourished, well developed, in no acute distress  HEENT: normal  Neck: no JVD, carotid bruits, or masses Cardiac: RRR; no murmurs, rubs, or gallops,no edema  Respiratory:  clear to auscultation bilaterally, normal work of breathing GI: soft, nontender, nondistended, + BS MS: no deformity or atrophy  Skin: warm and dry, device pocket is well healed Neuro:  Strength and sensation are intact Psych: euthymic mood, full affect  EKG:  EKG is not ordered today. Personal review of the ekg ordered 09/16/21 shows atrial sensed, ventricular paced  Device interrogation is reviewed today in detail.  See PaceArt for details.   Recent Labs: No results found for requested labs within last 8760 hours.    Lipid Panel  No results found for: CHOL, TRIG, HDL, CHOLHDL, VLDL, LDLCALC, LDLDIRECT   Wt Readings from Last 3 Encounters:  09/16/21 142 lb (64.4 kg)  03/18/21 154 lb (69.9 kg)  09/11/20 154 lb (69.9 kg)      Other studies Reviewed: Additional studies/ records that were reviewed today include: TTE 2020 Review of the above records today demonstrates:  Ejection fraction 60 to 65% Impaired diastolic relaxation Right ventricle normal in size and function   ASSESSMENT AND PLAN:  1.  Complete heart block: Status post Medtronic dual-chamber pacemaker.  Device functioning appropriately.  We Helios Kohlmann arrange for follow-up in device clinic.  2.  Hypertension: Currently well controlled.  No changes at this time.  3.  History of atrial fibrillation: CHA2DS2-VASc of at least 4.  Currently on Eliquis 5 mg twice daily.  Minimal noted on device interrogation.  4.  Secondary hypercoagulable state: Currently on Eliquis for atrial fibrillation as above  Current medicines are reviewed at length with the patient today.   The patient does not have concerns regarding her medicines.  The following changes were made today:  none  Labs/ tests ordered today include:  No orders  of the defined types were placed in this encounter.    Disposition:   FU with Demarrio Menges 1 year  Signed, Jaymes Revels Jorja LoaMartin Kadesha Virrueta, MD  12/08/2021 12:43 PM     [facility] [address] [place] [address] [phone] (office) [phone] (fax)

## 2021-12-10 ENCOUNTER — Telehealth: Payer: Self-pay

## 2021-12-10 NOTE — Telephone Encounter (Signed)
I have called patient and lvm for her serial number on the back of her home monitor so that we can put it in the system

## 2022-10-09 DIAGNOSIS — I2699 Other pulmonary embolism without acute cor pulmonale: Secondary | ICD-10-CM | POA: Insufficient documentation

## 2022-10-13 ENCOUNTER — Ambulatory Visit: Payer: Medicare Other | Admitting: Cardiology

## 2022-12-22 ENCOUNTER — Ambulatory Visit: Payer: Medicare Other | Attending: Cardiology | Admitting: Cardiology
